# Patient Record
Sex: Male | Born: 1980 | Race: White | Hispanic: No | Marital: Single | Smoking: Current every day smoker
Health system: Southern US, Community
[De-identification: ages and names within clinical notes are randomized; demographics above are authoritative.]

## PROBLEM LIST (undated history)

## (undated) DIAGNOSIS — M779 Enthesopathy, unspecified: Secondary | ICD-10-CM

## (undated) DIAGNOSIS — G8929 Other chronic pain: Secondary | ICD-10-CM

## (undated) DIAGNOSIS — M549 Dorsalgia, unspecified: Secondary | ICD-10-CM

## (undated) DIAGNOSIS — E119 Type 2 diabetes mellitus without complications: Secondary | ICD-10-CM

## (undated) DIAGNOSIS — K2970 Gastritis, unspecified, without bleeding: Secondary | ICD-10-CM

## (undated) DIAGNOSIS — M1990 Unspecified osteoarthritis, unspecified site: Secondary | ICD-10-CM

## (undated) HISTORY — PX: ANTERIOR CERVICAL DECOMP/DISCECTOMY FUSION: SHX1161

---

## 2004-03-03 ENCOUNTER — Emergency Department: Payer: Self-pay | Admitting: Emergency Medicine

## 2005-03-03 ENCOUNTER — Emergency Department: Payer: Self-pay | Admitting: Emergency Medicine

## 2005-03-15 ENCOUNTER — Emergency Department: Payer: Self-pay | Admitting: Internal Medicine

## 2005-07-22 ENCOUNTER — Ambulatory Visit: Payer: Self-pay | Admitting: Family Medicine

## 2005-11-23 ENCOUNTER — Emergency Department: Payer: Self-pay | Admitting: Emergency Medicine

## 2005-12-05 ENCOUNTER — Emergency Department: Payer: Self-pay | Admitting: Emergency Medicine

## 2006-01-14 ENCOUNTER — Emergency Department: Payer: Self-pay | Admitting: Emergency Medicine

## 2006-04-29 ENCOUNTER — Emergency Department: Payer: Self-pay | Admitting: Emergency Medicine

## 2006-12-15 ENCOUNTER — Emergency Department: Payer: Self-pay | Admitting: Emergency Medicine

## 2007-01-07 ENCOUNTER — Emergency Department: Payer: Self-pay | Admitting: Emergency Medicine

## 2007-02-02 ENCOUNTER — Emergency Department: Payer: Self-pay | Admitting: Emergency Medicine

## 2007-02-03 ENCOUNTER — Emergency Department: Payer: Self-pay | Admitting: Internal Medicine

## 2007-03-28 ENCOUNTER — Emergency Department: Payer: Self-pay | Admitting: Emergency Medicine

## 2007-05-02 ENCOUNTER — Emergency Department: Payer: Self-pay | Admitting: Emergency Medicine

## 2011-01-29 ENCOUNTER — Emergency Department: Payer: Self-pay | Admitting: Emergency Medicine

## 2011-03-03 ENCOUNTER — Emergency Department: Payer: Self-pay | Admitting: Emergency Medicine

## 2011-03-09 ENCOUNTER — Emergency Department: Payer: Self-pay | Admitting: *Deleted

## 2012-09-06 ENCOUNTER — Ambulatory Visit: Payer: Self-pay

## 2013-04-24 ENCOUNTER — Emergency Department: Payer: Self-pay | Admitting: Emergency Medicine

## 2014-10-16 ENCOUNTER — Emergency Department
Admission: EM | Admit: 2014-10-16 | Discharge: 2014-10-16 | Disposition: A | Discharge status: Home / Self Care | Payer: Self-pay | Attending: Emergency Medicine | Admitting: Emergency Medicine

## 2014-10-16 ENCOUNTER — Encounter: Payer: Self-pay | Admitting: Urgent Care

## 2014-10-16 DIAGNOSIS — Z720 Tobacco use: Secondary | ICD-10-CM | POA: Insufficient documentation

## 2014-10-16 DIAGNOSIS — K2970 Gastritis, unspecified, without bleeding: Secondary | ICD-10-CM | POA: Insufficient documentation

## 2014-10-16 HISTORY — DX: Enthesopathy, unspecified: M77.9

## 2014-10-16 HISTORY — DX: Unspecified osteoarthritis, unspecified site: M19.90

## 2014-10-16 HISTORY — DX: Other chronic pain: G89.29

## 2014-10-16 HISTORY — DX: Dorsalgia, unspecified: M54.9

## 2014-10-16 LAB — COMPREHENSIVE METABOLIC PANEL
ALBUMIN: 4.1 g/dL (ref 3.5–5.0)
ALT: 30 U/L (ref 17–63)
AST: 23 U/L (ref 15–41)
Alkaline Phosphatase: 66 U/L (ref 38–126)
Anion gap: 10 (ref 5–15)
BUN: 11 mg/dL (ref 6–20)
CHLORIDE: 102 mmol/L (ref 101–111)
CO2: 25 mmol/L (ref 22–32)
CREATININE: 1.42 mg/dL — AB (ref 0.61–1.24)
Calcium: 8.8 mg/dL — ABNORMAL LOW (ref 8.9–10.3)
GFR calc non Af Amer: >60 mL/min (ref >60)
Glucose, Bld: 107 mg/dL — ABNORMAL HIGH (ref 65–99)
POTASSIUM: 3.8 mmol/L (ref 3.5–5.1)
Sodium: 137 mmol/L (ref 135–145)
TOTAL PROTEIN: 7.6 g/dL (ref 6.5–8.1)
Total Bilirubin: 0.6 mg/dL (ref 0.3–1.2)

## 2014-10-16 LAB — CBC WITH DIFFERENTIAL/PLATELET
BASOS PCT: 1 %
Basophils Absolute: 0.1 10*3/uL (ref 0–0.1)
Eosinophils Absolute: 0.2 10*3/uL (ref 0–0.7)
Eosinophils Relative: 2 %
HCT: 44.5 % (ref 40.0–52.0)
Hemoglobin: 14.9 g/dL (ref 13.0–18.0)
LYMPHS PCT: 20 %
Lymphs Abs: 2.1 10*3/uL (ref 1.0–3.6)
MCH: 30.3 pg (ref 26.0–34.0)
MCHC: 33.5 g/dL (ref 32.0–36.0)
MCV: 90.4 fL (ref 80.0–100.0)
Monocytes Absolute: 0.7 10*3/uL (ref 0.2–1.0)
Monocytes Relative: 6 %
Neutro Abs: 7.5 10*3/uL — ABNORMAL HIGH (ref 1.4–6.5)
Neutrophils Relative %: 71 %
Platelets: 172 10*3/uL (ref 150–440)
RBC: 4.92 MIL/uL (ref 4.40–5.90)
RDW: 13.2 % (ref 11.5–14.5)
WBC: 10.6 10*3/uL (ref 3.8–10.6)

## 2014-10-16 LAB — LIPASE, BLOOD: Lipase: 22 U/L (ref 22–51)

## 2014-10-16 MED ORDER — SUCRALFATE 1 G PO TABS
1.0000 g | ORAL_TABLET | Freq: Four times a day (QID) | ORAL | Status: DC
Start: 2014-10-16 — End: 2015-04-25

## 2014-10-16 MED ORDER — SODIUM CHLORIDE 0.9 % IV BOLUS (SEPSIS)
1000.0000 mL | Freq: Once | INTRAVENOUS | Status: AC
  Administered 2014-10-16: 1000 mL via INTRAVENOUS

## 2014-10-16 MED ORDER — RANITIDINE HCL 150 MG PO TABS: 150.0000 mg | ORAL_TABLET | Freq: Two times a day (BID) | ORAL | Status: DC

## 2014-10-16 MED ORDER — GI COCKTAIL ~~LOC~~
30.0000 mL | Freq: Once | ORAL | Status: AC
  Administered 2014-10-16: 30 mL via ORAL

## 2014-10-16 MED ORDER — GI COCKTAIL ~~LOC~~
ORAL | Status: AC
  Administered 2014-10-16: 30 mL via ORAL
  Filled 2014-10-16: qty 30

## 2014-10-16 MED ORDER — LORAZEPAM 0.5 MG PO TABS
0.5000 mg | ORAL_TABLET | Freq: Once | ORAL | Status: AC
  Administered 2014-10-16: 0.5 mg via ORAL

## 2014-10-16 MED ORDER — LORAZEPAM 0.5 MG PO TABS
ORAL_TABLET | ORAL | Status: AC
  Administered 2014-10-16: 0.5 mg via ORAL
  Filled 2014-10-16: qty 1

## 2014-10-16 NOTE — Discharge Instructions (Signed)
Please seek medical attention for any high fevers, chest pain, shortness of breath, change in behavior, persistent vomiting, bloody stool or any other new or concerning symptoms. ° °Gastritis, Adult °Gastritis is soreness and swelling (inflammation) of the lining of the stomach. Gastritis can develop as a sudden onset (acute) or long-term (chronic) condition. If gastritis is not treated, it can lead to stomach bleeding and ulcers. °CAUSES  °Gastritis occurs when the stomach lining is weak or damaged. Digestive juices from the stomach then inflame the weakened stomach lining. The stomach lining may be weak or damaged due to viral or bacterial infections. One common bacterial infection is the Helicobacter pylori infection. Gastritis can also result from excessive alcohol consumption, taking certain medicines, or having too much acid in the stomach.  °SYMPTOMS  °In some cases, there are no symptoms. When symptoms are present, they may include: °· Pain or a burning sensation in the upper abdomen. °· Nausea. °· Vomiting. °· An uncomfortable feeling of fullness after eating. °DIAGNOSIS  °Your caregiver may suspect you have gastritis based on your symptoms and a physical exam. To determine the cause of your gastritis, your caregiver may perform the following: °· Blood or stool tests to check for the H pylori bacterium. °· Gastroscopy. A thin, flexible tube (endoscope) is passed down the esophagus and into the stomach. The endoscope has a light and camera on the end. Your caregiver uses the endoscope to view the inside of the stomach. °· Taking a tissue sample (biopsy) from the stomach to examine under a microscope. °TREATMENT  °Depending on the cause of your gastritis, medicines may be prescribed. If you have a bacterial infection, such as an H pylori infection, antibiotics may be given. If your gastritis is caused by too much acid in the stomach, H2 blockers or antacids may be given. Your caregiver may recommend that you  stop taking aspirin, ibuprofen, or other nonsteroidal anti-inflammatory drugs (NSAIDs). °HOME CARE INSTRUCTIONS °· Only take over-the-counter or prescription medicines as directed by your caregiver. °· If you were given antibiotic medicines, take them as directed. Finish them even if you start to feel better. °· Drink enough fluids to keep your urine clear or pale yellow. °· Avoid foods and drinks that make your symptoms worse, such as: °¨ Caffeine or alcoholic drinks. °¨ Chocolate. °¨ Peppermint or mint flavorings. °¨ Garlic and onions. °¨ Spicy foods. °¨ Citrus fruits, such as oranges, lemons, or limes. °¨ Tomato-based foods such as sauce, chili, salsa, and pizza. °¨ Fried and fatty foods. °· Eat small, frequent meals instead of large meals. °SEEK IMMEDIATE MEDICAL CARE IF:  °· You have black or dark red stools. °· You vomit blood or material that looks like coffee grounds. °· You are unable to keep fluids down. °· Your abdominal pain gets worse. °· You have a fever. °· You do not feel better after 1 week. °· You have any other questions or concerns. °MAKE SURE YOU: °· Understand these instructions. °· Will watch your condition. °· Will get help right away if you are not doing well or get worse. °Document Released: 03/10/2001 Document Revised: 09/15/2011 Document Reviewed: 04/29/2011 °Exit[employer]® Patient Information ©2015 [employer]. This information is not intended to replace advice given to you by your health care provider. Make sure you discuss any questions you have with your health care provider. ° °

## 2014-10-16 NOTE — ED Notes (Signed)
Patient presents with upper abdominal pain - states, "No matter how little I eat..I feel full." Patient reporting chronic IBU use for chronic pain; stopped recently with some resolution of symptoms. Denies N/V/D/F and urinary symptoms.

## 2014-10-16 NOTE — ED Provider Notes (Signed)
[facility] Emergency Department Provider Note    ____________________________________________  Time seen: 2145  I have reviewed the triage vital signs and the nursing notes.   HISTORY  Chief Complaint Abdominal Pain   History limited by: Not Limited   HPI Mark Hinton is a 34 y.o. male who presents to the emergency department today with complaints of abdominal pain. He states this is been going on for days. He describes it as being located in the left upper quadrant. He gets the pain primarily after. He describes it as a sensation of fullness. The pain does not radiate to his back or into his chest. He has not noticed any associated change in his stooling. No bloody or black and tarry stooling.No fevers. Of note the patient does state that he has chronically been taking ibuprofen for a long time for his chronic back pain. He states he has recently stop ibuprofen. He had been taking an antacid with ibuprofen but is no longer taking the antacid as well.    Past Medical History  Diagnosis Date  . Bone spur     NECK  . Arthritis   . Chronic back pain     There are no active problems to display for this patient.   Past Surgical History  Procedure Laterality Date  . Anterior cervical decomp/discectomy fusion      C6 to C7    No current outpatient prescriptions on file.  Allergies Review of patient's allergies indicates no known allergies.  No family history on file.  Social History History  Substance Use Topics  . Smoking status: Current Every Day Smoker  . Smokeless tobacco: Not on file  . Alcohol Use: No    Review of Systems  Constitutional: Negative for fever. Cardiovascular: Negative for chest pain. Respiratory: Negative for shortness of breath. Gastrointestinal: Positive for right upper quadrant abdominal pain Genitourinary: Negative for dysuria. Musculoskeletal: Negative for back pain. Skin: Negative for rash. Neurological:  Negative for headaches, focal weakness or numbness.  10-point ROS otherwise negative.  ____________________________________________   PHYSICAL EXAM:  VITAL SIGNS: ED Triage Vitals  Enc Vitals Group     BP 10/16/14 2133 137/83 mmHg     Pulse Rate 10/16/14 2133 116     Resp 10/16/14 2133 18     Temp 10/16/14 2133 98.6 F (37 C)     Temp Source 10/16/14 2133 Oral     SpO2 10/16/14 2133 96 %     Weight 10/16/14 2133 265 lb (120.203 kg)     Height 10/16/14 2133  (1.753 m)     Head Cir --      Peak Flow --      Pain Score 10/16/14 2134 4   Constitutional: Alert and oriented. Well appearing and in no distress. Eyes: Conjunctivae are normal. PERRL. Normal extraocular movements. ENT   Head: Normocephalic and atraumatic.   Nose: No congestion/rhinnorhea.   Mouth/Throat: Mucous membranes are moist.   Neck: No stridor. Hematological/Lymphatic/Immunilogical: No cervical lymphadenopathy. Cardiovascular: Normal rate, regular rhythm.  No murmurs, rubs, or gallops. Respiratory: Normal respiratory effort without tachypnea nor retractions. Breath sounds are clear and equal bilaterally. No wheezes/rales/rhonchi. Gastrointestinal: Soft and nontender. No distention. No rebound. No guarding. Genitourinary: Deferred Musculoskeletal: Normal range of motion in all extremities. No joint effusions.  No lower extremity tenderness nor edema. Neurologic:  Normal speech and language. No gross focal neurologic deficits are appreciated. Speech is normal.  Skin:  Skin is warm, dry and intact. No rash  noted. Psychiatric: Mood and affect are normal. Speech and behavior are normal. Patient exhibits appropriate insight and judgment.  ____________________________________________    LABS (pertinent positives/negatives)  Labs Reviewed  COMPREHENSIVE METABOLIC PANEL - Abnormal; Notable for the following:    Glucose, Bld 107 (*)    Creatinine, Ser 1.42 (*)    Calcium 8.8 (*)    All other  components within normal limits  CBC WITH DIFFERENTIAL/PLATELET - Abnormal; Notable for the following:    Neutro Abs 7.5 (*)    All other components within normal limits  LIPASE, BLOOD     ____________________________________________   EKG  None  ____________________________________________    RADIOLOGY  None  ____________________________________________   PROCEDURES  Procedure(s) performed: None  Critical Care performed: No  ____________________________________________   INITIAL IMPRESSION / ASSESSMENT AND PLAN / ED COURSE  Pertinent labs & imaging results that were available during my care of the patient were reviewed by me and considered in my medical decision making (see chart for details).  Patient presented to the emergency department with concerns for left upper quadrant abdominal pain. Patient did have a long history of ibuprofen use. This point I think gastritis most likely given physical exam. We will however check blood work and give  GI cocktail.  ----------------------------------------- 10:57 PM on 10/16/2014 -----------------------------------------  Patient feels much improved. Will discharge home with an antacid as well as sucralfate. Discussed return precautions.  ____________________________________________   FINAL CLINICAL IMPRESSION(S) / ED DIAGNOSES  Final diagnoses:  Gastritis     Phineas SemenGraydon Nickalous Stingley, MD 10/16/14 2257

## 2014-11-26 ENCOUNTER — Emergency Department: Payer: Self-pay

## 2014-11-26 ENCOUNTER — Emergency Department
Admission: EM | Admit: 2014-11-26 | Discharge: 2014-11-26 | Disposition: A | Discharge status: Home / Self Care | Payer: Self-pay | Attending: Emergency Medicine | Admitting: Emergency Medicine

## 2014-11-26 ENCOUNTER — Encounter: Payer: Self-pay | Admitting: Emergency Medicine

## 2014-11-26 DIAGNOSIS — Z720 Tobacco use: Secondary | ICD-10-CM | POA: Insufficient documentation

## 2014-11-26 DIAGNOSIS — K5901 Slow transit constipation: Secondary | ICD-10-CM | POA: Insufficient documentation

## 2014-11-26 DIAGNOSIS — F419 Anxiety disorder, unspecified: Secondary | ICD-10-CM | POA: Insufficient documentation

## 2014-11-26 DIAGNOSIS — N390 Urinary tract infection, site not specified: Secondary | ICD-10-CM | POA: Insufficient documentation

## 2014-11-26 LAB — BASIC METABOLIC PANEL
Anion gap: 8 (ref 5–15)
BUN: 13 mg/dL (ref 6–20)
CO2: 26 mmol/L (ref 22–32)
Calcium: 9.2 mg/dL (ref 8.9–10.3)
Chloride: 102 mmol/L (ref 101–111)
Creatinine, Ser: 1.21 mg/dL (ref 0.61–1.24)
GFR calc Af Amer: >60 mL/min (ref >60)
GFR calc non Af Amer: >60 mL/min (ref >60)
Glucose, Bld: 118 mg/dL — ABNORMAL HIGH (ref 65–99)
POTASSIUM: 3.9 mmol/L (ref 3.5–5.1)
SODIUM: 136 mmol/L (ref 135–145)

## 2014-11-26 LAB — CBC WITH DIFFERENTIAL/PLATELET
BASOS ABS: 0.1 10*3/uL (ref 0–0.1)
Basophils Relative: 1 %
EOS ABS: 0.2 10*3/uL (ref 0–0.7)
EOS PCT: 2 %
HCT: 47.3 % (ref 40.0–52.0)
Hemoglobin: 15.8 g/dL (ref 13.0–18.0)
Lymphocytes Relative: 23 %
Lymphs Abs: 2.5 10*3/uL (ref 1.0–3.6)
MCH: 30 pg (ref 26.0–34.0)
MCHC: 33.4 g/dL (ref 32.0–36.0)
MCV: 90 fL (ref 80.0–100.0)
MONO ABS: 0.7 10*3/uL (ref 0.2–1.0)
Monocytes Relative: 7 %
Neutro Abs: 7.3 10*3/uL — ABNORMAL HIGH (ref 1.4–6.5)
Neutrophils Relative %: 67 %
Platelets: 183 10*3/uL (ref 150–440)
RBC: 5.26 MIL/uL (ref 4.40–5.90)
RDW: 13.8 % (ref 11.5–14.5)
WBC: 10.8 10*3/uL — AB (ref 3.8–10.6)

## 2014-11-26 LAB — URINALYSIS COMPLETE WITH MICROSCOPIC (ARMC ONLY)
BILIRUBIN URINE: NEGATIVE
Bacteria, UA: NONE SEEN
Glucose, UA: NEGATIVE mg/dL
Ketones, ur: NEGATIVE mg/dL
Nitrite: NEGATIVE
PH: 6 (ref 5.0–8.0)
Protein, ur: NEGATIVE mg/dL
Specific Gravity, Urine: 1.024 (ref 1.005–1.030)

## 2014-11-26 MED ORDER — SULFAMETHOXAZOLE-TRIMETHOPRIM 800-160 MG PO TABS
1.0000 | ORAL_TABLET | Freq: Once | ORAL | Status: AC
  Administered 2014-11-26: 1 via ORAL
  Filled 2014-11-26: qty 1

## 2014-11-26 MED ORDER — SULFAMETHOXAZOLE-TRIMETHOPRIM 800-160 MG PO TABS: 1.0000 | ORAL_TABLET | Freq: Two times a day (BID) | ORAL | Status: DC

## 2014-11-26 MED ORDER — POLYETHYLENE GLYCOL 3350 17 G PO PACK: 17.0000 g | PACK | Freq: Every day | ORAL | Status: DC

## 2014-11-26 MED ORDER — PHENAZOPYRIDINE HCL 200 MG PO TABS
200.0000 mg | ORAL_TABLET | Freq: Once | ORAL | Status: AC
  Administered 2014-11-26: 200 mg via ORAL
  Filled 2014-11-26: qty 1

## 2014-11-26 MED ORDER — CYCLOBENZAPRINE HCL 10 MG PO TABS: 10.0000 mg | ORAL_TABLET | Freq: Three times a day (TID) | ORAL | Status: AC | PRN

## 2014-11-26 NOTE — ED Notes (Addendum)
Patient complains of LLQ , side pain, and back pain all started last night. Pt reports decreased appetite past few days. No n/v. Denies any urination difficulty. LBM yesterday. Pt states "I've been having a lot of anxiety attacks lately."

## 2014-11-26 NOTE — ED Notes (Signed)
rx given to patient

## 2014-11-26 NOTE — Discharge Instructions (Signed)
Constipation °Constipation is when a person has fewer than three bowel movements a week, has difficulty having a bowel movement, or has stools that are dry, hard, or larger than normal. As people grow older, constipation is more common. If you try to fix constipation with medicines that make you have a bowel movement (laxatives), the problem may get worse. Long-term laxative use may cause the muscles of the colon to become weak. A low-fiber diet, not taking in enough fluids, and taking certain medicines may make constipation worse.  °CAUSES  °· Certain medicines, such as antidepressants, pain medicine, iron supplements, antacids, and water pills.   °· Certain diseases, such as diabetes, irritable bowel syndrome (IBS), thyroid disease, or depression.   °· Not drinking enough water.   °· Not eating enough fiber-rich foods.   °· Stress or travel.   °· Lack of physical activity or exercise.   °· Ignoring the urge to have a bowel movement.   °· Using laxatives too much.   °SIGNS AND SYMPTOMS  °· Having fewer than three bowel movements a week.   °· Straining to have a bowel movement.   °· Having stools that are hard, dry, or larger than normal.   °· Feeling full or bloated.   °· Pain in the lower abdomen.   °· Not feeling relief after having a bowel movement.   °DIAGNOSIS  °Your health care provider will take a medical history and perform a physical exam. Further testing may be done for severe constipation. Some tests may include: °· A barium enema X-ray to examine your rectum, colon, and, sometimes, your small intestine.   °· A sigmoidoscopy to examine your lower colon.   °· A colonoscopy to examine your entire colon. °TREATMENT  °Treatment will depend on the severity of your constipation and what is causing it. Some dietary treatments include drinking more fluids and eating more fiber-rich foods. Lifestyle treatments may include regular exercise. If these diet and lifestyle recommendations do not help, your health care  provider may recommend taking over-the-counter laxative medicines to help you have bowel movements. Prescription medicines may be prescribed if over-the-counter medicines do not work.  °HOME CARE INSTRUCTIONS  °· Eat foods that have a lot of fiber, such as fruits, vegetables, whole grains, and beans. °· Limit foods high in fat and processed sugars, such as french fries, hamburgers, cookies, candies, and soda.   °· A fiber supplement may be added to your diet if you cannot get enough fiber from foods.   °· Drink enough fluids to keep your urine clear or pale yellow.   °· Exercise regularly or as directed by your health care provider.   °· Go to the restroom when you have the urge to go. Do not hold it.   °· Only take over-the-counter or prescription medicines as directed by your health care provider. Do not take other medicines for constipation without talking to your health care provider first.   °SEEK IMMEDIATE MEDICAL CARE IF:  °· You have bright red blood in your stool.   °· Your constipation lasts for more than 4 days or gets worse.   °· You have abdominal or rectal pain.   °· You have thin, pencil-like stools.   °· You have unexplained weight loss. °MAKE SURE YOU:  °· Understand these instructions. °· Will watch your condition. °· Will get help right away if you are not doing well or get worse. °Document Released: 12/13/2003 Document Revised: 03/21/2013 Document Reviewed: 12/26/2012 °ExitCare® Patient Information ©2015 [employer]. This information is not intended to replace advice given to you by your health care provider. Make sure you discuss any questions   you have with your health care provider. °Urinary Tract Infection °Urinary tract infections (UTIs) can develop anywhere along your urinary tract. Your urinary tract is your body's drainage system for removing wastes and extra water. Your urinary tract includes two kidneys, two ureters, a bladder, and a urethra. Your kidneys are a pair of bean-shaped  organs. Each kidney is about the size of your fist. They are located below your ribs, one on each side of your spine. °CAUSES °Infections are caused by microbes, which are microscopic organisms, including fungi, viruses, and bacteria. These organisms are so small that they can only be seen through a microscope. Bacteria are the microbes that most commonly cause UTIs. °SYMPTOMS  °Symptoms of UTIs may vary by age and gender of the patient and by the location of the infection. Symptoms in young women typically include a frequent and intense urge to urinate and a painful, burning feeling in the bladder or urethra during urination. Older women and men are more likely to be tired, shaky, and weak and have muscle aches and abdominal pain. A fever may mean the infection is in your kidneys. Other symptoms of a kidney infection include pain in your back or sides below the ribs, nausea, and vomiting. °DIAGNOSIS °To diagnose a UTI, your caregiver will ask you about your symptoms. Your caregiver also will ask to provide a urine sample. The urine sample will be tested for bacteria and white blood cells. White blood cells are made by your body to help fight infection. °TREATMENT  °Typically, UTIs can be treated with medication. Because most UTIs are caused by a bacterial infection, they usually can be treated with the use of antibiotics. The choice of antibiotic and length of treatment depend on your symptoms and the type of bacteria causing your infection. °HOME CARE INSTRUCTIONS °· If you were prescribed antibiotics, take them exactly as your caregiver instructs you. Finish the medication even if you feel better after you have only taken some of the medication. °· Drink enough water and fluids to keep your urine clear or pale yellow. °· Avoid caffeine, tea, and carbonated beverages. They tend to irritate your bladder. °· Empty your bladder often. Avoid holding urine for long periods of time. °· Empty your bladder before and  after sexual intercourse. °· After a bowel movement, women should cleanse from front to back. Use each tissue only once. °SEEK MEDICAL CARE IF:  °· You have back pain. °· You develop a fever. °· Your symptoms do not begin to resolve within 3 days. °SEEK IMMEDIATE MEDICAL CARE IF:  °· You have severe back pain or lower abdominal pain. °· You develop chills. °· You have nausea or vomiting. °· You have continued burning or discomfort with urination. °MAKE SURE YOU:  °· Understand these instructions. °· Will watch your condition. °· Will get help right away if you are not doing well or get worse. °Document Released: 12/24/2004 Document Revised: 09/15/2011 Document Reviewed: 04/24/2011 °ExitCare® Patient Information ©2015 [employer]. This information is not intended to replace advice given to you by your health care provider. Make sure you discuss any questions you have with your health care provider. ° °

## 2014-11-26 NOTE — ED Notes (Signed)
Pt to ED with c/o LLQ abd. Pain and lower back pain since last Thursday, denies any n,v,d, also states he has been having a lot of panic attacks lately

## 2014-11-26 NOTE — ED Provider Notes (Signed)
[facility] Emergency Department Provider Note     Time seen: ----------------------------------------- 8:52 AM on 11/26/2014 -----------------------------------------    I have reviewed the triage vital signs and the nursing notes.   HISTORY  Chief Complaint Abdominal Pain and Back Pain    HPI Mark Hinton is a 34 y.o. male who presents ER with left lower quadrant sided back pain started last night. Patient reports decreased appetite for the past few days, denies fever or chills, denies chest pain or shortness of breath. Patient states been having a lot of panic attacks recently. Describes as a burning dull pain in his left side.   Past Medical History  Diagnosis Date  . Bone spur     NECK  . Arthritis   . Chronic back pain     There are no active problems to display for this patient.   Past Surgical History  Procedure Laterality Date  . Anterior cervical decomp/discectomy fusion      C6 to C7    Allergies Review of patient's allergies indicates no known allergies.  Social History Social History  Substance Use Topics  . Smoking status: Current Every Day Smoker  . Smokeless tobacco: None  . Alcohol Use: No    Review of Systems Constitutional: Negative for fever. Eyes: Negative for visual changes. ENT: Negative for sore throat. Cardiovascular: Negative for chest pain. Respiratory: Negative for shortness of breath. Gastrointestinal: Positive for abdominal pain Genitourinary: Negative for dysuria. Musculoskeletal: Negative for back pain. Skin: Negative for rash. Neurological: Negative for headaches, focal weakness or numbness. Psychiatric: Positive for anxiety 10-point ROS otherwise negative.  ____________________________________________   PHYSICAL EXAM:  VITAL SIGNS: ED Triage Vitals  Enc Vitals Group     BP 11/26/14 0824 135/93 mmHg     Pulse Rate 11/26/14 0824 126     Resp --      Temp 11/26/14 0824 98.3 F (36.8 C)     Temp Source 11/26/14 0824 Oral     SpO2 11/26/14 0824 97 %     Weight 11/26/14 0824 260 lb (117.935 kg)     Height 11/26/14 0824 5\' 9"  (1.753 m)     Head Cir --      Peak Flow --      Pain Score 11/26/14 0825 8     Pain Loc --      Pain Edu? --      Excl. in GC? --     Constitutional: Alert and oriented. Well appearing and in no distress. Eyes: Conjunctivae are normal. PERRL. Normal extraocular movements. ENT   Head: Normocephalic and atraumatic.   Nose: No congestion/rhinnorhea.   Mouth/Throat: Mucous membranes are moist.   Neck: No stridor. Cardiovascular: Normal rate, regular rhythm. Normal and symmetric distal pulses are present in all extremities. No murmurs, rubs, or gallops. Respiratory: Normal respiratory effort without tachypnea nor retractions. Breath sounds are clear and equal bilaterally. No wheezes/rales/rhonchi. Gastrointestinal: Soft and nontender. No distention. No abdominal bruits.  Musculoskeletal: Nontender with normal range of motion in all extremities. No joint effusions.  No lower extremity tenderness nor edema. Neurologic:  Normal speech and language. No gross focal neurologic deficits are appreciated. Speech is normal. No gait instability. Skin:  Skin is warm, dry and intact. No rash noted. Psychiatric: Mood and affect are normal. Speech and behavior are normal. Patient exhibits appropriate insight and judgment. ____________________________________________  ED COURSE:  Pertinent labs & imaging results that were available during my care of the patient were reviewed by me  and considered in my medical decision making (see chart for details). Flank pain and clear etiology. We'll check basic labs and KUB ____________________________________________    LABS (pertinent positives/negatives)  Labs Reviewed  CBC WITH DIFFERENTIAL/PLATELET - Abnormal; Notable for the following:    WBC 10.8 (*)    Neutro Abs 7.3 (*)    All other components within  normal limits  BASIC METABOLIC PANEL - Abnormal; Notable for the following:    Glucose, Bld 118 (*)    All other components within normal limits  URINALYSIS COMPLETEWITH MICROSCOPIC ([facility] ONLY) - Abnormal; Notable for the following:    Color, Urine YELLOW (*)    APPearance CLEAR (*)    Hgb urine dipstick 1+ (*)    Leukocytes, UA 2+ (*)    Squamous Epithelial / LPF 0-5 (*)    All other components within normal limits    RADIOLOGY Images were viewed by me  KUB is unremarkable except her constipation FINDINGS: Gas and stool in the colon. No significant gas within the small bowel loops. Limited evaluation for free air on these images. No large calcifications or stones in the region of the kidneys. Nonobstructive bowel gas pattern.  IMPRESSION: No acute abnormality. ____________________________________________  FINAL ASSESSMENT AND PLAN  Flank pain, constipation, UTI  Plan: Patient with labs and imaging as dictated above. Patient with a combination of UTI plus constipation. Discharge with Septra and MiraLAX. Advised to have heating pad and sinus therapy and stretching for treatment of his back pain. Encouraged him to exercise and follow-up with his doctor.  Emily Filbert, MD   Emily Filbert, MD 11/26/14 [phone]

## 2014-12-22 ENCOUNTER — Emergency Department: Payer: Self-pay

## 2014-12-22 ENCOUNTER — Encounter: Payer: Self-pay | Admitting: Emergency Medicine

## 2014-12-22 ENCOUNTER — Emergency Department
Admission: EM | Admit: 2014-12-22 | Discharge: 2014-12-22 | Disposition: A | Discharge status: Home / Self Care | Payer: Self-pay | Attending: Emergency Medicine | Admitting: Emergency Medicine

## 2014-12-22 DIAGNOSIS — Z79899 Other long term (current) drug therapy: Secondary | ICD-10-CM | POA: Insufficient documentation

## 2014-12-22 DIAGNOSIS — Z8719 Personal history of other diseases of the digestive system: Secondary | ICD-10-CM | POA: Insufficient documentation

## 2014-12-22 DIAGNOSIS — M79671 Pain in right foot: Secondary | ICD-10-CM | POA: Insufficient documentation

## 2014-12-22 DIAGNOSIS — Z720 Tobacco use: Secondary | ICD-10-CM | POA: Insufficient documentation

## 2014-12-22 MED ORDER — TRAMADOL HCL 50 MG PO TABS: 50.0000 mg | ORAL_TABLET | Freq: Four times a day (QID) | ORAL | Status: DC | PRN

## 2014-12-22 MED ORDER — PREDNISONE 10 MG PO TABS: ORAL_TABLET | ORAL | Status: DC

## 2014-12-22 NOTE — ED Provider Notes (Signed)
[facility] Emergency Department Provider Note  ____________________________________________  Time seen: Approximately 4:29 PM  I have reviewed the triage vital signs and the nursing notes.   HISTORY  Chief Complaint Foot Pain   HPI Mark Hinton is a 34 y.o. male is here with complaint of right foot pain. Patient states he was moving 2 weeks ago but is unaware of any injury. Patient states that he woke this morning with pain and swelling in his foot. He denies any previous injury or history of gout. He has not taken any over-the-counter medication for this. Area in question is on the top of his right foot with some swelling and pain is constant. Currently he rates his pain as 6 out of 10. Range of motion or walking increases his pain.   Past Medical History  Diagnosis Date  . Bone spur     NECK  . Arthritis   . Chronic back pain     There are no active problems to display for this patient.   Past Surgical History  Procedure Laterality Date  . Anterior cervical decomp/discectomy fusion      C6 to C7    Current Outpatient Rx  Name  Route  Sig  Dispense  Refill  . cyclobenzaprine (FLEXERIL) 10 MG tablet   Oral   Take 1 tablet (10 mg total) by mouth every 8 (eight) hours as needed for muscle spasms.   30 tablet   1   . polyethylene glycol (MIRALAX / GLYCOLAX) packet   Oral   Take 17 g by mouth daily.   14 each   0   . predniSONE (DELTASONE) 10 MG tablet      TAKE 3 TABLETS WITH FOOD FOR 3 DAYS   9 tablet   0   . ranitidine (ZANTAC) 150 MG tablet   Oral   Take 1 tablet (150 mg total) by mouth 2 (two) times daily.   60 tablet   0   . sucralfate (CARAFATE) 1 G tablet   Oral   Take 1 tablet (1 g total) by mouth 4 (four) times daily.   90 tablet   0   . sulfamethoxazole-trimethoprim (BACTRIM DS) 800-160 MG per tablet   Oral   Take 1 tablet by mouth 2 (two) times daily.   20 tablet   0   . traMADol (ULTRAM) 50 MG tablet   Oral    Take 1 tablet (50 mg total) by mouth every 6 (six) hours as needed.   20 tablet   0     Allergies Review of patient's allergies indicates no known allergies.  No family history on file.  Social History Social History  Substance Use Topics  . Smoking status: Current Every Day Smoker -- 0.50 packs/day    Types: Cigarettes  . Smokeless tobacco: None  . Alcohol Use: No    Review of Systems Constitutional: No fever/chills Cardiovascular: Denies chest pain. Respiratory: Denies shortness of breath. Gastrointestinal: History of gastritis  No nausea, no vomiting.   Musculoskeletal: Negative for back pain. Right foot pain Skin: Negative for rash. Neurological: Negative for headaches, focal weakness or numbness.  10-point ROS otherwise negative.  ____________________________________________   PHYSICAL EXAM:  VITAL SIGNS: ED Triage Vitals  Enc Vitals Group     BP 12/22/14 1538 126/84 mmHg     Pulse Rate 12/22/14 1538 118     Resp 12/22/14 1538 18     Temp 12/22/14 1538 97.9 F (36.6 C)  Temp src --      SpO2 12/22/14 1538 99 %     Weight 12/22/14 1538 261 lb (118.389 kg)     Height 12/22/14 1538  (1.753 m)     Head Cir --      Peak Flow --      Pain Score 12/22/14 1539 6     Pain Loc --      Pain Edu? --      Excl. in GC? --     Constitutional: Alert and oriented. Well appearing and in no acute distress. Eyes: Conjunctivae are normal. PERRL. EOMI. Head: Atraumatic. Nose: No congestion/rhinnorhea. Neck: No stridor.   Cardiovascular: Normal rate, regular rhythm. Grossly normal heart sounds.  Good peripheral circulation. Respiratory: Normal respiratory effort.  No retractions. Lungs CTAB. Gastrointestinal: Soft and nontender. No distention. Musculoskeletal: Right foot dorsal aspect there is some soft tissue swelling at the proximal portion of the foot. Moderate tenderness on palpation of soft tissue. There is no gross deformity. There is restriction in range  of motion secondary to pain. Nontender and no gross deformity of the ankle. Neurologic:  Normal speech and language. No gross focal neurologic deficits are appreciated. No gait instability. Skin:  Skin is warm, dry and intact. There is no erythema, ecchymosis or abrasions noted on the dorsal aspect of the right foot. Psychiatric: Mood and affect are normal. Speech and behavior are normal.  ____________________________________________   LABS (all labs ordered are listed, but only abnormal results are displayed)  Labs Reviewed - No data to display RADIOLOGY  Right foot no acute abnormality seen per radiologist I, Tommi Rumps, personally viewed and evaluated these images (plain radiographs) as part of my medical decision making.  ____________________________________________   PROCEDURES  Procedure(s) performed: None  Critical Care performed: No  ____________________________________________   INITIAL IMPRESSION / ASSESSMENT AND PLAN / ED COURSE  Pertinent labs & imaging results that were available during my care of the patient were reviewed by me and considered in my medical decision making (see chart for details)  Patient is aware of x-ray results. There is no erythema or warmth which does not suggest that this is gout. Patient was placed in a postop shoe and started on tramadol for pain and prednisone 3 tablets for 3 days. He was told to make sure he ate while taking the prednisone. He is to follow-up with the podiatrist if no improvement. Etiology of his pain is unknown at this time.   ____________________________________________   FINAL CLINICAL IMPRESSION(S) / ED DIAGNOSES  Final diagnoses:  Foot pain, right      Tommi Rumps, PA-C 12/22/14 1708  Jene Every, MD 12/22/14 2351

## 2014-12-22 NOTE — Discharge Instructions (Signed)
FOLLOW UP WITH DR. TROXLER IF ANY CONTINUED PROBLEMS  WEAR WOODEN SHOE FOR SUPPORT AND TAKE MEDICATIONS AS DIRECTED

## 2014-12-22 NOTE — ED Notes (Signed)
Pt discharged home after verbalizing understanding of discharge instructions; nad noted. 

## 2014-12-22 NOTE — ED Notes (Signed)
Pt reports pain in his right foot with "swollen spot" after moving 2 weeks ago that went away until he awakened this morning to more pain and swelling in the same foot. Pain in right ankle and top of right foot. Pt very tender, and jumped when this nurse was palpating for pulse in foot. Pt alert & oriented with warm, dry skin. NAD noted.

## 2014-12-22 NOTE — ED Notes (Signed)
States was moving 2 weeks ago, does not remember specific injury but noted swelling top of R foot and pain at that time and since

## 2015-04-24 ENCOUNTER — Encounter: Payer: Self-pay | Admitting: *Deleted

## 2015-04-24 ENCOUNTER — Emergency Department
Admission: EM | Admit: 2015-04-24 | Discharge: 2015-04-25 | Disposition: A | Discharge status: Home / Self Care | Payer: Self-pay | Attending: Emergency Medicine | Admitting: Emergency Medicine

## 2015-04-24 DIAGNOSIS — K8020 Calculus of gallbladder without cholecystitis without obstruction: Secondary | ICD-10-CM | POA: Insufficient documentation

## 2015-04-24 DIAGNOSIS — Z7952 Long term (current) use of systemic steroids: Secondary | ICD-10-CM | POA: Insufficient documentation

## 2015-04-24 DIAGNOSIS — F17210 Nicotine dependence, cigarettes, uncomplicated: Secondary | ICD-10-CM | POA: Insufficient documentation

## 2015-04-24 DIAGNOSIS — R109 Unspecified abdominal pain: Secondary | ICD-10-CM

## 2015-04-24 DIAGNOSIS — Z79899 Other long term (current) drug therapy: Secondary | ICD-10-CM | POA: Insufficient documentation

## 2015-04-24 LAB — CBC
HEMATOCRIT: 43.3 % (ref 40.0–52.0)
Hemoglobin: 14.4 g/dL (ref 13.0–18.0)
MCH: 29.9 pg (ref 26.0–34.0)
MCHC: 33.2 g/dL (ref 32.0–36.0)
MCV: 89.9 fL (ref 80.0–100.0)
PLATELETS: 184 10*3/uL (ref 150–440)
RBC: 4.81 MIL/uL (ref 4.40–5.90)
RDW: 13.9 % (ref 11.5–14.5)
WBC: 11.8 10*3/uL — AB (ref 3.8–10.6)

## 2015-04-24 LAB — COMPREHENSIVE METABOLIC PANEL
ALT: 20 U/L (ref 17–63)
AST: 18 U/L (ref 15–41)
Albumin: 4 g/dL (ref 3.5–5.0)
Alkaline Phosphatase: 69 U/L (ref 38–126)
Anion gap: 5 (ref 5–15)
BUN: 13 mg/dL (ref 6–20)
CHLORIDE: 103 mmol/L (ref 101–111)
CO2: 29 mmol/L (ref 22–32)
Calcium: 9 mg/dL (ref 8.9–10.3)
Creatinine, Ser: 1.49 mg/dL — ABNORMAL HIGH (ref 0.61–1.24)
Glucose, Bld: 107 mg/dL — ABNORMAL HIGH (ref 65–99)
POTASSIUM: 4.4 mmol/L (ref 3.5–5.1)
Sodium: 137 mmol/L (ref 135–145)
Total Bilirubin: 1 mg/dL (ref 0.3–1.2)
Total Protein: 7.7 g/dL (ref 6.5–8.1)

## 2015-04-24 LAB — LIPASE, BLOOD: LIPASE: 38 U/L (ref 11–51)

## 2015-04-24 NOTE — ED Notes (Signed)
Unable to void at this time.

## 2015-04-24 NOTE — ED Notes (Signed)
Pt reports abd pain for 2-3 days.  No n/v/d.  Pt states burning sensation in left side of abdomen.  No back pain.  No urinary sx.  Pt alert

## 2015-04-25 ENCOUNTER — Emergency Department: Payer: Self-pay

## 2015-04-25 LAB — URINALYSIS COMPLETE WITH MICROSCOPIC (ARMC ONLY)
Bacteria, UA: NONE SEEN
Bilirubin Urine: NEGATIVE
Glucose, UA: NEGATIVE mg/dL
Hgb urine dipstick: NEGATIVE
Ketones, ur: NEGATIVE mg/dL
Leukocytes, UA: NEGATIVE
Nitrite: NEGATIVE
PH: 6 (ref 5.0–8.0)
Protein, ur: NEGATIVE mg/dL
SPECIFIC GRAVITY, URINE: 1.02 (ref 1.005–1.030)

## 2015-04-25 MED ORDER — IOHEXOL 240 MG/ML SOLN
25.0000 mL | Freq: Once | INTRAMUSCULAR | Status: AC | PRN
  Administered 2015-04-25: 25 mL via ORAL

## 2015-04-25 MED ORDER — IOHEXOL 300 MG/ML  SOLN
125.0000 mL | Freq: Once | INTRAMUSCULAR | Status: AC | PRN
  Administered 2015-04-25: 125 mL via INTRAVENOUS

## 2015-04-25 MED ORDER — SUCRALFATE 1 G PO TABS: 1.0000 g | ORAL_TABLET | Freq: Four times a day (QID) | ORAL | Status: DC | PRN

## 2015-04-25 NOTE — ED Notes (Signed)

## 2015-04-25 NOTE — ED Provider Notes (Signed)
[facility] Emergency Department Provider Note  ____________________________________________  Time seen: Approximately 12:11 AM  I have reviewed the triage vital signs and the nursing notes.   HISTORY  Chief Complaint Abdominal Pain    HPI Mark Hinton is a 35 y.o. male with a history of gastritis who takes sucralfate and omeprazole presents by private vehicle with report of 2-3 days of gradual onset but worsening left-sided abdominal pain.  He denies nausea, vomiting, and diarrhea.  He states that it started as a dull ache in the left side of his abdomen but is now a burning sensation.  He has no epigastric discomfort and no pain in the right lower quadrant or in the right upper quadrant.  Nothing makes it better and nothing makes it worse.  He describes it as mild to moderate and it was keeping him from being able to sleep tonight.  He is also worried given his history of gastritis that "something might be wrong".  He smokes cigarettes but does not drink alcohol.  He has not had any blood in his stool or any black or tarry stools.He has never had a colonoscopy.   Past Medical History  Diagnosis Date  . Bone spur     NECK  . Arthritis   . Chronic back pain     There are no active problems to display for this patient.   Past Surgical History  Procedure Laterality Date  . Anterior cervical decomp/discectomy fusion      C6 to C7    Current Outpatient Rx  Name  Route  Sig  Dispense  Refill  . cyclobenzaprine (FLEXERIL) 10 MG tablet   Oral   Take 1 tablet (10 mg total) by mouth every 8 (eight) hours as needed for muscle spasms.   30 tablet   1   . polyethylene glycol (MIRALAX / GLYCOLAX) packet   Oral   Take 17 g by mouth daily.   14 each   0   . predniSONE (DELTASONE) 10 MG tablet      TAKE 3 TABLETS WITH FOOD FOR 3 DAYS   9 tablet   0   . ranitidine (ZANTAC) 150 MG tablet   Oral   Take 1 tablet (150 mg total) by mouth 2 (two) times daily.   60 tablet   0   . sucralfate (CARAFATE) 1 g tablet   Oral   Take 1 tablet (1 g total) by mouth 4 (four) times daily as needed (for abdominal discomfort, nausea, and/or vomiting).   30 tablet   1   . sulfamethoxazole-trimethoprim (BACTRIM DS) 800-160 MG per tablet   Oral   Take 1 tablet by mouth 2 (two) times daily.   20 tablet   0   . traMADol (ULTRAM) 50 MG tablet   Oral   Take 1 tablet (50 mg total) by mouth every 6 (six) hours as needed.   20 tablet   0     Allergies Review of patient's allergies indicates no known allergies.  No family history on file.  Social History Social History  Substance Use Topics  . Smoking status: Current Every Day Smoker -- 0.50 packs/day    Types: Cigarettes  . Smokeless tobacco: None  . Alcohol Use: No    Review of Systems Constitutional: No fever/chills Eyes: No visual changes. ENT: No sore throat. Cardiovascular: Denies chest pain. Respiratory: Denies shortness of breath. Gastrointestinal: Left-sided burning abdominal pain.  No nausea, no vomiting.  No diarrhea.  No  constipation.  No blood in the stool or black and tarry stools. Genitourinary: Negative for dysuria. Musculoskeletal: Negative for back pain. Skin: Negative for rash. Neurological: Negative for headaches, focal weakness or numbness.  10-point ROS otherwise negative.  ____________________________________________   PHYSICAL EXAM:  VITAL SIGNS: ED Triage Vitals  Enc Vitals Group     BP 04/24/15 2247 107/90 mmHg     Pulse Rate 04/24/15 2247 86     Resp 04/24/15 2247 20     Temp 04/24/15 2247 98.9 F (37.2 C)     Temp Source 04/24/15 2247 Oral     SpO2 04/24/15 2247 99 %     Weight 04/24/15 2247 260 lb (117.935 kg)     Height 04/24/15 2247  (1.753 m)     Head Cir --      Peak Flow --      Pain Score 04/24/15 2248 4     Pain Loc --      Pain Edu? --      Excl. in GC? --     Constitutional: Alert and oriented. Well appearing and in no acute  distress. Eyes: Conjunctivae are normal. PERRL. EOMI. Head: Atraumatic. Nose: No congestion/rhinnorhea. Mouth/Throat: Mucous membranes are moist.  Oropharynx non-erythematous. Neck: No stridor.   Cardiovascular: Normal rate, regular rhythm. Grossly normal heart sounds.  Good peripheral circulation. Respiratory: Normal respiratory effort.  No retractions. Lungs CTAB. Gastrointestinal: Soft and obese.  No tenderness to palpation of McBurney's point, negative Murphy's sign and no right upper quadrant tenderness.  Tender to palpation of the left side of the abdomen with rebound tenderness.  No evidence of peritonitis. Musculoskeletal: No lower extremity tenderness nor edema.  No joint effusions. Neurologic:  Normal speech and language. No gross focal neurologic deficits are appreciated.  Skin:  Skin is warm, dry and intact. No rash noted. Psychiatric: Mood and affect are normal. Speech and behavior are normal.  ____________________________________________   LABS (all labs ordered are listed, but only abnormal results are displayed)  Labs Reviewed  COMPREHENSIVE METABOLIC PANEL - Abnormal; Notable for the following:    Glucose, Bld 107 (*)    Creatinine, Ser 1.49 (*)    All other components within normal limits  CBC - Abnormal; Notable for the following:    WBC 11.8 (*)    All other components within normal limits  URINALYSIS COMPLETEWITH MICROSCOPIC ([facility] ONLY) - Abnormal; Notable for the following:    Color, Urine YELLOW (*)    APPearance CLEAR (*)    Squamous Epithelial / LPF 0-5 (*)    All other components within normal limits  LIPASE, BLOOD   ____________________________________________  EKG  None ____________________________________________  RADIOLOGY   Ct Abdomen Pelvis W Contrast  04/25/2015  CLINICAL DATA:  Left lower quadrant pain with rebound tenderness. Abdominal pain for 3 days. Burning sensation left abdomen. History of gastritis. EXAM: CT ABDOMEN AND PELVIS  WITH CONTRAST TECHNIQUE: Multidetector CT imaging of the abdomen and pelvis was performed using the standard protocol following bolus administration of intravenous contrast. CONTRAST:  OMNIPAQUE IOHEXOL 300 MG/ML  SOLN COMPARISON:  None. FINDINGS: Mild dependent changes in the lung bases. Large stone in the gallbladder. No inflammatory wall thickening or infiltration. The liver, spleen, pancreas, adrenal glands, kidneys, abdominal aorta, inferior vena cava, and retroperitoneal lymph nodes are unremarkable. Stomach, small bowel, and colon are not abnormally distended. No wall thickening is appreciated. No free air or free fluid in the abdomen. Abdominal wall musculature appears intact. Pelvis: The  appendix is normal. Bladder wall is not thickened. Prostate gland is not enlarged. No free or loculated pelvic fluid collections. No pelvic mass or lymphadenopathy. No evidence of diverticulitis. No destructive bone lesions. IMPRESSION: Cholelithiasis. No acute process demonstrated in the abdomen or pelvis. No evidence of bowel obstruction or inflammation. Electronically Signed   By: Burman Nieves M.D.   On: 04/25/2015 01:45    ____________________________________________   PROCEDURES  Procedure(s) performed: None  Critical Care performed: No ____________________________________________   INITIAL IMPRESSION / ASSESSMENT AND PLAN / ED COURSE  Pertinent labs & imaging results that were available during my care of the patient were reviewed by me and considered in my medical decision making (see chart for details).  Generally well-appearing with normal vital signs and reassuring labs.  However, he is tender on the left side of his abdomen with some rebound tenderness, and it is possible that this represents a diverticulitis.  The patient is concerned with his history of gastritis and wants to evaluate further.  I will obtain a CT scan with oral and IV contrast to help guide the patient's outpatient  therapy.  ----------------------------------------- 2:24 AM on 04/25/2015 -----------------------------------------  Single gallstone without any sign of obstruction or cholelithiasis on the CT scan.  Creatinine is slightly elevated but BUN is normal, vital signs are stable, GFR is greater than 60, no acute concerns.  I discussed the gallstone with the patient and advised outpatient follow-up but also explained that it is unclear if this is the cause of his left-sided abdominal pain.  I gave my usual and customary return precautions.  Patient understands and agrees with the plan. ____________________________________________  FINAL CLINICAL IMPRESSION(S) / ED DIAGNOSES  Final diagnoses:  Left sided abdominal pain  Cholelithiasis without cholecystitis      NEW MEDICATIONS STARTED DURING THIS VISIT:  New Prescriptions   SUCRALFATE (CARAFATE) 1 G TABLET    Take 1 tablet (1 g total) by mouth 4 (four) times daily as needed (for abdominal discomfort, nausea, and/or vomiting).     Loleta Rose, MD 04/25/15 Emeline Darling

## 2015-04-25 NOTE — Discharge Instructions (Signed)
You have been seen in the Emergency Department (ED) for abdominal pain.  Your evaluation did not identify a clear cause of your symptoms but was generally reassuring.  We did discover the you have a gallstone and it is important that you follow-up with a surgeon as an outpatient to discuss whether or not you should have your gallbladder removed at a convenient time.  It is unclear that this is the cause of your left sided abdominal pain, however.  Please follow up as instructed above regarding todays emergent visit and the symptoms that are bothering you.  Return to the ED if your abdominal pain worsens or fails to improve, you develop bloody vomiting, bloody diarrhea, you are unable to tolerate fluids due to vomiting, fever greater than 101, or other symptoms that concern you.   Abdominal Pain, Adult Many things can cause abdominal pain. Usually, abdominal pain is not caused by a disease and will improve without treatment. It can often be observed and treated at home. Your health care provider will do a physical exam and possibly order blood tests and X-rays to help determine the seriousness of your pain. However, in many cases, more time must pass before a clear cause of the pain can be found. Before that point, your health care provider may not know if you need more testing or further treatment. HOME CARE INSTRUCTIONS Monitor your abdominal pain for any changes. The following actions may help to alleviate any discomfort you are experiencing:  Only take over-the-counter or prescription medicines as directed by your health care provider.  Do not take laxatives unless directed to do so by your health care provider.  Try a clear liquid diet (broth, tea, or water) as directed by your health care provider. Slowly move to a bland diet as tolerated. SEEK MEDICAL CARE IF:  You have unexplained abdominal pain.  You have abdominal pain associated with nausea or diarrhea.  You have pain when you  urinate or have a bowel movement.  You experience abdominal pain that wakes you in the night.  You have abdominal pain that is worsened or improved by eating food.  You have abdominal pain that is worsened with eating fatty foods.  You have a fever. SEEK IMMEDIATE MEDICAL CARE IF:  Your pain does not go away within 2 hours.  You keep throwing up (vomiting).  Your pain is felt only in portions of the abdomen, such as the right side or the left lower portion of the abdomen.  You pass bloody or black tarry stools. MAKE SURE YOU:  Understand these instructions.  Will watch your condition.  Will get help right away if you are not doing well or get worse.   This information is not intended to replace advice given to you by your health care provider. Make sure you discuss any questions you have with your health care provider.   Document Released: 12/24/2004 Document Revised: 12/05/2014 Document Reviewed: 11/23/2012 Elsevier Interactive Patient Education 2016 [employer]  Biliary Colic Biliary colic is a pain in the upper abdomen. The pain:  Is usually felt on the right side of the abdomen, but it may also be felt in the center of the abdomen, just below the breastbone (sternum).  May spread back toward the right shoulder blade.  May be steady or irregular.  May be accompanied by nausea and vomiting. Most of the time, the pain goes away in 1-5 hours. After the most intense pain passes, the abdomen may continue to ache mildly  for about 24 hours. Biliary colic is caused by a blockage in the bile duct. The bile duct is a pathway that carries bile--a liquid that helps to digest fats--from the gallbladder to the small intestine. Biliary colic usually occurs after eating, when the digestive system demands bile. The pain develops when muscle cells contract forcefully to try to move the blockage so that bile can get by. HOME CARE INSTRUCTIONS  Take medicines only as directed by your  health care provider.  Drink enough fluid to keep your urine clear or pale yellow.  Avoid fatty, greasy, and fried foods. These kinds of foods increase your body's demand for bile.  Avoid any foods that make your pain worse.  Avoid overeating.  Avoid having a large meal after fasting. SEEK MEDICAL CARE IF:  You develop a fever.  Your pain gets worse.  You vomit.  You develop nausea that prevents you from eating and drinking. SEEK IMMEDIATE MEDICAL CARE IF:  You suddenly develop a fever and shaking chills.  You develop a yellowish discoloration (jaundice) of:  Skin.  Whites of the eyes.  Mucous membranes.  You have continuous or severe pain that is not relieved with medicines.  You have nausea and vomiting that is not relieved with medicines.  You develop dizziness or you faint.   This information is not intended to replace advice given to you by your health care provider. Make sure you discuss any questions you have with your health care provider.   Document Released: 08/17/2005 Document Revised: 07/31/2014 Document Reviewed: 12/26/2013 Elsevier Interactive Patient Education [employer]

## 2015-08-10 ENCOUNTER — Encounter: Payer: Self-pay | Admitting: Emergency Medicine

## 2015-08-10 ENCOUNTER — Emergency Department: Payer: Self-pay

## 2015-08-10 ENCOUNTER — Emergency Department
Admission: EM | Admit: 2015-08-10 | Discharge: 2015-08-10 | Disposition: A | Discharge status: Home / Self Care | Payer: Self-pay | Attending: Emergency Medicine | Admitting: Emergency Medicine

## 2015-08-10 DIAGNOSIS — R079 Chest pain, unspecified: Secondary | ICD-10-CM | POA: Insufficient documentation

## 2015-08-10 DIAGNOSIS — M1990 Unspecified osteoarthritis, unspecified site: Secondary | ICD-10-CM | POA: Insufficient documentation

## 2015-08-10 DIAGNOSIS — Z7952 Long term (current) use of systemic steroids: Secondary | ICD-10-CM | POA: Insufficient documentation

## 2015-08-10 DIAGNOSIS — F17210 Nicotine dependence, cigarettes, uncomplicated: Secondary | ICD-10-CM | POA: Insufficient documentation

## 2015-08-10 DIAGNOSIS — Z792 Long term (current) use of antibiotics: Secondary | ICD-10-CM | POA: Insufficient documentation

## 2015-08-10 DIAGNOSIS — Z79899 Other long term (current) drug therapy: Secondary | ICD-10-CM | POA: Insufficient documentation

## 2015-08-10 DIAGNOSIS — K2970 Gastritis, unspecified, without bleeding: Secondary | ICD-10-CM | POA: Insufficient documentation

## 2015-08-10 HISTORY — DX: Gastritis, unspecified, without bleeding: K29.70

## 2015-08-10 LAB — CBC
HEMATOCRIT: 40.5 % (ref 40.0–52.0)
HEMOGLOBIN: 13.5 g/dL (ref 13.0–18.0)
MCH: 30.4 pg (ref 26.0–34.0)
MCHC: 33.4 g/dL (ref 32.0–36.0)
MCV: 91 fL (ref 80.0–100.0)
Platelets: 170 10*3/uL (ref 150–440)
RBC: 4.45 MIL/uL (ref 4.40–5.90)
RDW: 13.9 % (ref 11.5–14.5)
WBC: 9.6 10*3/uL (ref 3.8–10.6)

## 2015-08-10 LAB — BASIC METABOLIC PANEL
ANION GAP: 6 (ref 5–15)
BUN: 11 mg/dL (ref 6–20)
CO2: 27 mmol/L (ref 22–32)
Calcium: 9.1 mg/dL (ref 8.9–10.3)
Chloride: 104 mmol/L (ref 101–111)
Creatinine, Ser: 1.07 mg/dL (ref 0.61–1.24)
GFR calc Af Amer: >60 mL/min (ref >60)
GFR calc non Af Amer: >60 mL/min (ref >60)
GLUCOSE: 117 mg/dL — AB (ref 65–99)
POTASSIUM: 3.8 mmol/L (ref 3.5–5.1)
Sodium: 137 mmol/L (ref 135–145)

## 2015-08-10 LAB — TROPONIN I: Troponin I: <0.03 ng/mL (ref <0.031)

## 2015-08-10 MED ORDER — GI COCKTAIL ~~LOC~~
30.0000 mL | Freq: Once | ORAL | Status: AC
  Administered 2015-08-10: 30 mL via ORAL
  Filled 2015-08-10: qty 30

## 2015-08-10 MED ORDER — OXYCODONE-ACETAMINOPHEN 5-325 MG PO TABS
1.0000 | ORAL_TABLET | ORAL | Status: DC | PRN
  Administered 2015-08-10: 1 via ORAL
  Filled 2015-08-10: qty 1

## 2015-08-10 NOTE — ED Notes (Signed)
MD at bedside. 

## 2015-08-10 NOTE — ED Provider Notes (Signed)
[facility] Emergency Department Provider Note   ____________________________________________  Time seen: Approximately 710 AM  I have reviewed the triage vital signs and the nursing notes.   HISTORY  Chief Complaint Chest Pain   HPI Mark Hinton is a 35 y.o. male with a history of gastritis was presenting to the emergency department today with lower chest pain which he describes as a burning pain. He says it is a 1 out of 10 right now. He says it started at about midnight and was a 4-5 out of 10 at that time. He says it is radiating through to his back. It is not associated with any nausea, vomiting, shortness of breath or diaphoresis. He says that he had this similar pain about a week ago after eating sausage. He says he ate the same sausage yesterday and again had the same pain. He took Pepto-Bismol home without any relief. Does take a daily omeprazole. Says that his GI cocktail hasn't lead to this sort of pain in the past. Denies any history of blood clots or heart disease.   Past Medical History  Diagnosis Date  . Bone spur     NECK  . Arthritis   . Chronic back pain   . Gastritis     There are no active problems to display for this patient.   Past Surgical History  Procedure Laterality Date  . Anterior cervical decomp/discectomy fusion      C6 to C7    Current Outpatient Rx  Name  Route  Sig  Dispense  Refill  . cyclobenzaprine (FLEXERIL) 10 MG tablet   Oral   Take 1 tablet (10 mg total) by mouth every 8 (eight) hours as needed for muscle spasms.   30 tablet   1   . polyethylene glycol (MIRALAX / GLYCOLAX) packet   Oral   Take 17 g by mouth daily.   14 each   0   . predniSONE (DELTASONE) 10 MG tablet      TAKE 3 TABLETS WITH FOOD FOR 3 DAYS   9 tablet   0   . ranitidine (ZANTAC) 150 MG tablet   Oral   Take 1 tablet (150 mg total) by mouth 2 (two) times daily.   60 tablet   0   . sucralfate (CARAFATE) 1 g tablet   Oral  Take 1 tablet (1 g total) by mouth 4 (four) times daily as needed (for abdominal discomfort, nausea, and/or vomiting).   30 tablet   1   . sulfamethoxazole-trimethoprim (BACTRIM DS) 800-160 MG per tablet   Oral   Take 1 tablet by mouth 2 (two) times daily.   20 tablet   0   . traMADol (ULTRAM) 50 MG tablet   Oral   Take 1 tablet (50 mg total) by mouth every 6 (six) hours as needed.   20 tablet   0     Allergies Review of patient's allergies indicates no known allergies.  No family history on file.  Social History Social History  Substance Use Topics  . Smoking status: Current Every Day Smoker -- 0.50 packs/day    Types: Cigarettes  . Smokeless tobacco: None  . Alcohol Use: No    Review of Systems Constitutional: No fever/chills Eyes: No visual changes. ENT: No sore throat. Cardiovascular: As above Respiratory: Denies shortness of breath. Gastrointestinal: No abdominal pain.  No nausea, no vomiting.  No diarrhea.  No constipation. Genitourinary: Negative for dysuria. Musculoskeletal: Negative for back pain. Skin:  Negative for rash. Neurological: Negative for headaches, focal weakness or numbness.  10-point ROS otherwise negative.  ____________________________________________   PHYSICAL EXAM:  VITAL SIGNS: ED Triage Vitals  Enc Vitals Group     BP 08/10/15 0344 135/82 mmHg     Pulse Rate 08/10/15 0344 63     Resp 08/10/15 0344 18     Temp 08/10/15 0344 98.1 F (36.7 C)     Temp Source 08/10/15 0344 Oral     SpO2 08/10/15 0344 98 %     Weight 08/10/15 0344 260 lb (117.935 kg)     Height 08/10/15 0344 5\' 9"  (1.753 m)     Head Cir --      Peak Flow --      Pain Score 08/10/15 0344 8     Pain Loc --      Pain Edu? --      Excl. in GC? --     Constitutional: Alert and oriented. Well appearing and in no acute distress. Eyes: Conjunctivae are normal. PERRL. EOMI. Head: Atraumatic. Nose: No congestion/rhinnorhea. Mouth/Throat: Mucous membranes are  moist.   Neck: No stridor.   Cardiovascular: Normal rate, regular rhythm. Grossly normal heart sounds.  Good peripheral circulation with intact bilateral radial as well as dorsalis pedis pulses. Respiratory: Normal respiratory effort.  No retractions. Lungs CTAB. Gastrointestinal: Soft and nontender. No distention. No abdominal bruits. No CVA tenderness. Musculoskeletal: No lower extremity tenderness nor edema.  No joint effusions. Neurologic:  Normal speech and language. No gross focal neurologic deficits are appreciated.  Skin:  Skin is warm, dry and intact. No rash noted. Psychiatric: Mood and affect are normal. Speech and behavior are normal.  ____________________________________________   LABS (all labs ordered are listed, but only abnormal results are displayed)  Labs Reviewed  BASIC METABOLIC PANEL - Abnormal; Notable for the following:    Glucose, Bld 117 (*)    All other components within normal limits  CBC  TROPONIN I   ____________________________________________  EKG  ED ECG REPORT I, Arelia Longest, the attending physician, personally viewed and interpreted this ECG.   Date: 08/10/2015  EKG Time: 337  Rate: 58  Rhythm: sinus bradycardia  Axis: Normal  Intervals:none  ST&T Change: No ST segment elevation or depression. No abnormal T-wave inversion.  ____________________________________________  RADIOLOGY  DG Chest 2 View (Final result) Result time: 08/10/15 04:07:37   Final result by Rad Results In Interface (08/10/15 04:07:37)   Narrative:   CLINICAL DATA: Midchest pain, onset this morning.  EXAM: CHEST 2 VIEW  COMPARISON: 03/09/2011  FINDINGS: The lungs are clear. The pulmonary vasculature is normal. Heart size is normal. Hilar and mediastinal contours are unremarkable. There is no pleural effusion.  IMPRESSION: No active cardiopulmonary disease.   Electronically Signed By: Ellery Plunk M.D. On: 08/10/2015 04:07            ____________________________________________   PROCEDURES    ____________________________________________   INITIAL IMPRESSION / ASSESSMENT AND PLAN / ED COURSE  Pertinent labs & imaging results that were available during my care of the patient were reviewed by me and considered in my medical decision making (see chart for details).  ----------------------------------------- 8:20 AM on 08/10/2015 -----------------------------------------  Patient received a GI cocktail and his pain is now completely gone. Nose not taking any more sausage and that this is likely trigger food for his gastritis which is likely causing reflux at this time.PERC negative.  Heart score of 0.  We'll discharge to home. ____________________________________________  FINAL CLINICAL IMPRESSION(S) / ED DIAGNOSES  Final diagnoses:  Chest pain, unspecified chest pain type  Gastritis      NEW MEDICATIONS STARTED DURING THIS VISIT:  New Prescriptions   No medications on file     Note:  This document was prepared using Dragon voice recognition software and may include unintentional dictation errors.    Myrna Blazeravid Matthew Halen Antenucci, MD 08/10/15 [phone]

## 2015-08-10 NOTE — ED Notes (Signed)
Patient with complaint of central chest pain that started around midnight. Patient reports that he took pepto without any relief. Patient denies shortness of breath or nausea.

## 2017-04-30 IMAGING — CR DG ABDOMEN 1V
2 series · 2 of 2 positions shown · non-contrast
Comparison: 09/06/2012

CLINICAL DATA: Left flank pain.  Recent gastritis.

EXAM:
ABDOMEN - 1 VIEW

[abdomen kub (1 of 2)]
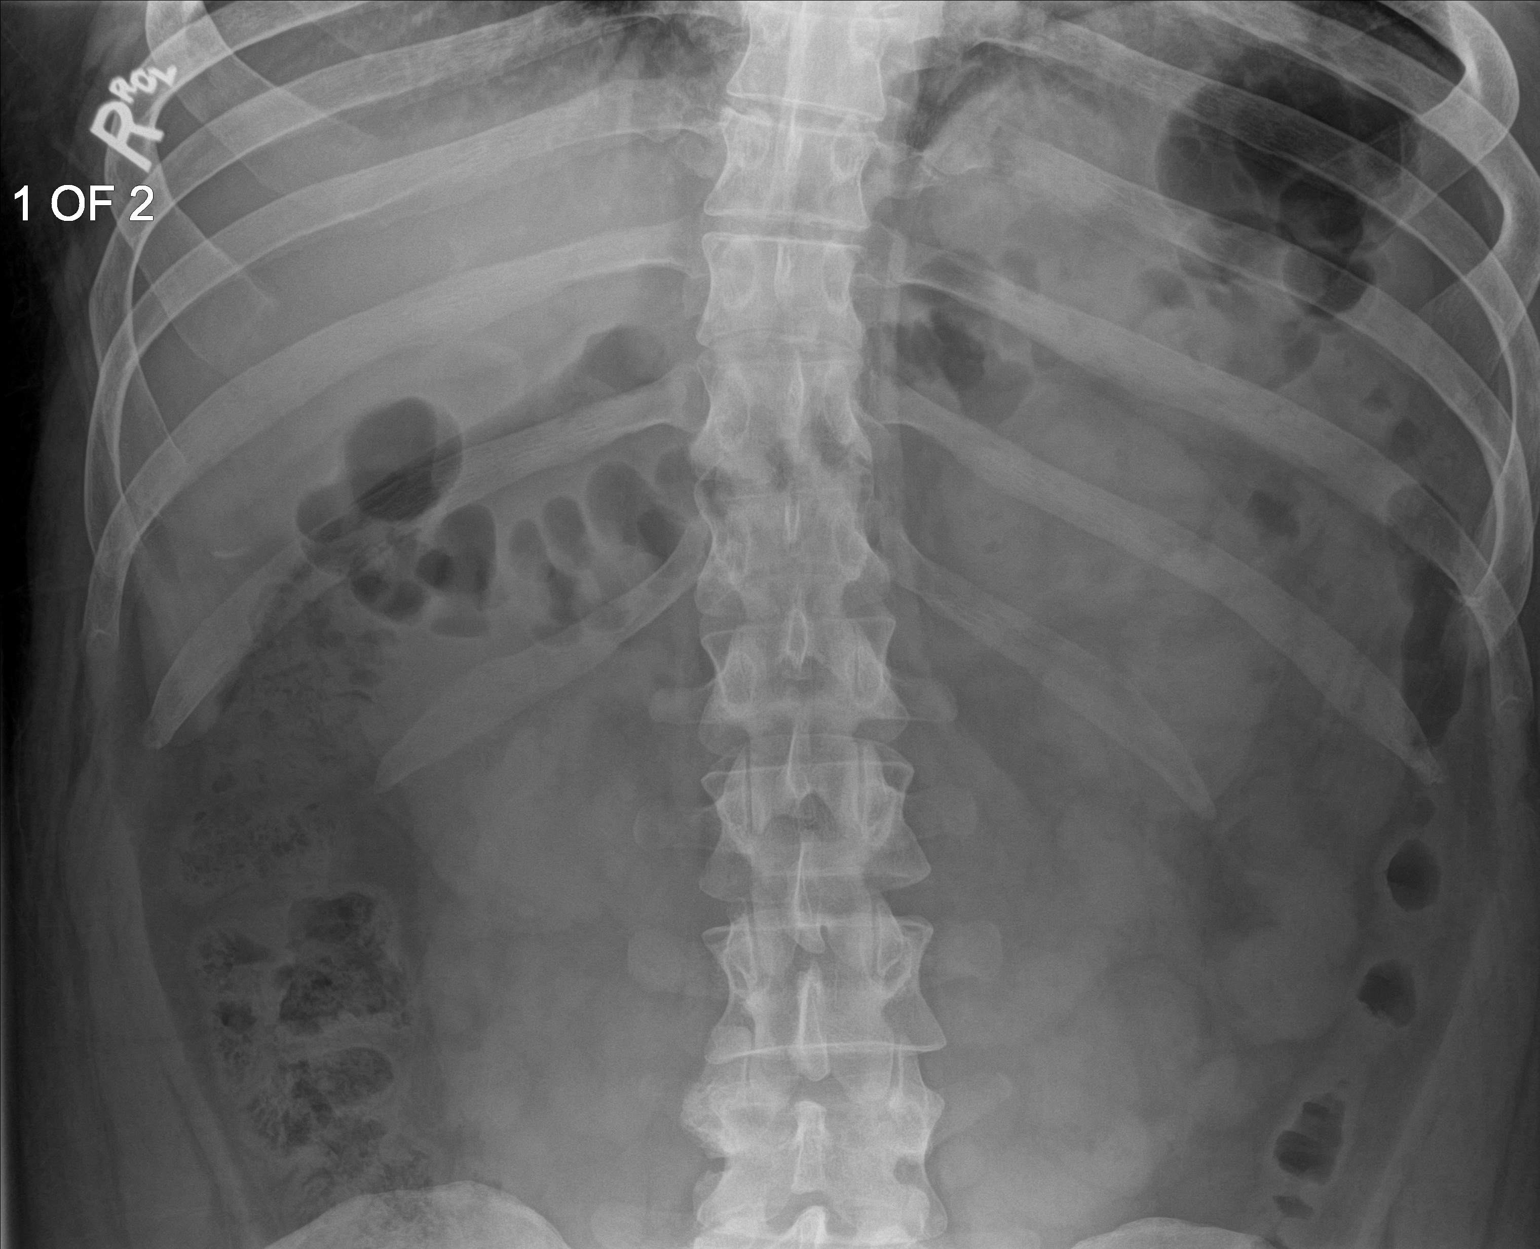

[abdomen kub (2 of 2)]
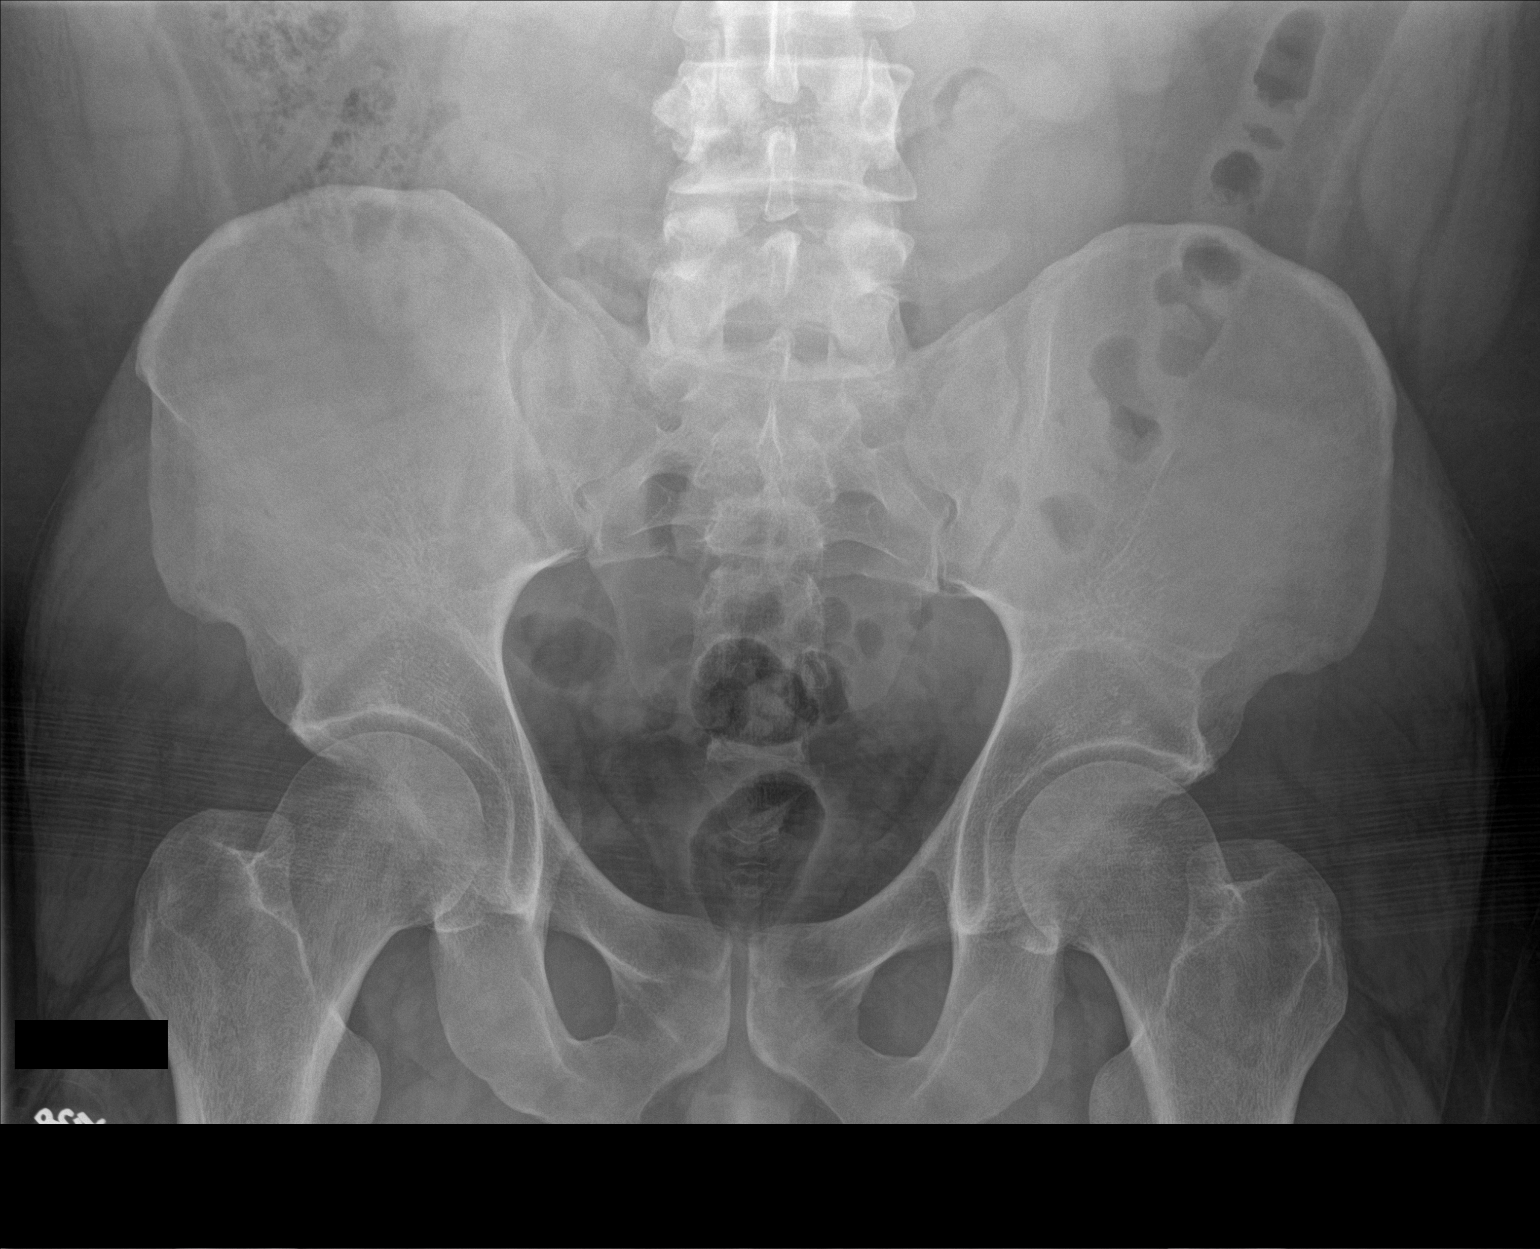

[2 of 2 positions shown; findings below may reference images not displayed]

FINDINGS: Gas and stool in the colon. No significant gas within the small
bowel loops. Limited evaluation for free air on these images. No
large calcifications or stones in the region of the kidneys.
Nonobstructive bowel gas pattern.
IMPRESSION: No acute abnormality.

## 2017-11-02 ENCOUNTER — Encounter: Payer: Self-pay | Admitting: Emergency Medicine

## 2017-11-02 ENCOUNTER — Emergency Department
Admission: EM | Admit: 2017-11-02 | Discharge: 2017-11-02 | Disposition: A | Discharge status: Home / Self Care | Payer: Medicaid - Out of State | Attending: Emergency Medicine | Admitting: Emergency Medicine

## 2017-11-02 DIAGNOSIS — Y998 Other external cause status: Secondary | ICD-10-CM | POA: Insufficient documentation

## 2017-11-02 DIAGNOSIS — Z79899 Other long term (current) drug therapy: Secondary | ICD-10-CM | POA: Insufficient documentation

## 2017-11-02 DIAGNOSIS — F17210 Nicotine dependence, cigarettes, uncomplicated: Secondary | ICD-10-CM | POA: Insufficient documentation

## 2017-11-02 DIAGNOSIS — Y929 Unspecified place or not applicable: Secondary | ICD-10-CM | POA: Diagnosis not present

## 2017-11-02 DIAGNOSIS — X58XXXA Exposure to other specified factors, initial encounter: Secondary | ICD-10-CM | POA: Diagnosis not present

## 2017-11-02 DIAGNOSIS — Y9389 Activity, other specified: Secondary | ICD-10-CM | POA: Diagnosis not present

## 2017-11-02 DIAGNOSIS — S39012A Strain of muscle, fascia and tendon of lower back, initial encounter: Secondary | ICD-10-CM | POA: Diagnosis not present

## 2017-11-02 DIAGNOSIS — S3992XA Unspecified injury of lower back, initial encounter: Secondary | ICD-10-CM | POA: Diagnosis present

## 2017-11-02 MED ORDER — NAPROXEN 500 MG PO TABS: 500.0000 mg | ORAL_TABLET | Freq: Two times a day (BID) | ORAL | Status: DC

## 2017-11-02 MED ORDER — LIDOCAINE 5 % EX PTCH: 1.0000 | MEDICATED_PATCH | Freq: Two times a day (BID) | CUTANEOUS | 0 refills | Status: DC

## 2017-11-02 MED ORDER — ORPHENADRINE CITRATE ER 100 MG PO TB12: 100.0000 mg | ORAL_TABLET | Freq: Two times a day (BID) | ORAL | 0 refills | Status: DC

## 2017-11-02 NOTE — ED Notes (Signed)
See triage note  Presents with lower back pain  States pain is non radiating   Unsure of injury  Ambulates well   Denies any urinary sxs'

## 2017-11-02 NOTE — ED Provider Notes (Signed)
[facility] Emergency Department Provider Note   ____________________________________________   First MD Initiated Contact with Patient 11/02/17 1024     (approximate)  I have reviewed the triage vital signs and the nursing notes.   HISTORY  Chief Complaint Back Pain    HPI Mark Hinton is a 37 y.o. male patient complain of right low back pain for 2 weeks.  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  Patient denies radicular component to his back pain.  Patient denies bladder bowel dysfunction.  Patient describes his pain is "achy/spasmatic".  No palliative measure for complaint.  Patient rates the pain as 4/10.  Patient states pain decreases when he is off for 2 days.  Patient the pain returns at the end of each workday.  Patient states he has a history of chronic back pain with no other provocative incident.  No acute findings on previous imaging.  No surgical history for back pain. Past Medical History:  Diagnosis Date  . Arthritis   . Bone spur    NECK  . Chronic back pain   . Gastritis     There are no active problems to display for this patient.   Past Surgical History:  Procedure Laterality Date  . ANTERIOR CERVICAL DECOMP/DISCECTOMY FUSION     C6 to C7    Prior to Admission medications   Medication Sig Start Date End Date Taking? Authorizing Provider  polyethylene glycol (MIRALAX / GLYCOLAX) packet Take 17 g by mouth daily. 11/26/14   Emily FilbertWilliams, Jonathan E, MD  predniSONE (DELTASONE) 10 MG tablet TAKE 3 TABLETS WITH FOOD FOR 3 DAYS 12/22/14   Tommi RumpsSummers, Rhonda L, PA-C  ranitidine (ZANTAC) 150 MG tablet Take 1 tablet (150 mg total) by mouth 2 (two) times daily. 10/16/14 10/16/15  Phineas SemenGoodman, Graydon, MD  sucralfate (CARAFATE) 1 g tablet Take 1 tablet (1 g total) by mouth 4 (four) times daily as needed (for abdominal discomfort, nausea, and/or vomiting). 04/25/15   Loleta RoseForbach, Cory, MD  sulfamethoxazole-trimethoprim  (BACTRIM DS) 800-160 MG per tablet Take 1 tablet by mouth 2 (two) times daily. 11/26/14   Emily FilbertWilliams, Jonathan E, MD  traMADol (ULTRAM) 50 MG tablet Take 1 tablet (50 mg total) by mouth every 6 (six) hours as needed. 12/22/14   Tommi RumpsSummers, Rhonda L, PA-C    Allergies Patient has no known allergies.  No family history on file.  Social History Social History   Tobacco Use  . Smoking status: Current Every Day Smoker    Packs/day: 0.50    Types: Cigarettes  Substance Use Topics  . Alcohol use: No  . Drug use: Not on file    Review of Systems Constitutional: No fever/chills Eyes: No visual changes. ENT: No sore throat. Cardiovascular: Denies chest pain. Respiratory: Denies shortness of breath. Gastrointestinal: No abdominal pain.  No nausea, no vomiting.  No diarrhea.  No constipation. Genitourinary: Negative for dysuria. Musculoskeletal: Positive for back pain. Skin: Negative for rash. Neurological: Negative for headaches, focal weakness or numbness.   ____________________________________________   PHYSICAL EXAM:  VITAL SIGNS: ED Triage Vitals  Enc Vitals Group     BP 11/02/17 1007 125/88     Pulse Rate 11/02/17 1007 97     Resp 11/02/17 1007 20     Temp 11/02/17 1007 98.1 F (36.7 C)     Temp Source 11/02/17 1007 Oral     SpO2 11/02/17 1007 97 %     Weight 11/02/17 1008 259 lb (117.5  kg)     Height 11/02/17 1008 5\' 9"  (1.753 m)     Head Circumference --      Peak Flow --      Pain Score 11/02/17 1008 4     Pain Loc --      Pain Edu? --      Excl. in GC? --    Constitutional: Alert and oriented. Well appearing and in no acute distress. Hematological/Lymphatic/Immunilogical: No cervical lymphadenopathy. Cardiovascular: Normal rate, regular rhythm. Grossly normal heart sounds.  Good peripheral circulation. Respiratory: Normal respiratory effort.  No retractions. Lungs CTAB. Musculoskeletal: No obvious spinal deformity of the lumbar spine.  There is no guarding  palpation spinal processes.  Patient has moderate guarding palpation right paraspinal muscle area.  Patient muscle spasm with left lateral movements.  Patient has negative straight leg test in supine position.. Neurologic:  Normal speech and language. No gross focal neurologic deficits are appreciated. No gait instability. Skin:  Skin is warm, dry and intact. No rash noted. Psychiatric: Mood and affect are normal. Speech and behavior are normal.  ____________________________________________   LABS (all labs ordered are listed, but only abnormal results are displayed)  Labs Reviewed - No data to display ____________________________________________  EKG   ____________________________________________  RADIOLOGY  ED MD interpretation:    Official radiology report(s): No results found.  ____________________________________________   PROCEDURES  Procedure(s) performed: None  Procedures  Critical Care performed: No  ____________________________________________   INITIAL IMPRESSION / ASSESSMENT AND PLAN / ED COURSE  As part of my medical decision making, I reviewed the following data within the electronic MEDICAL RECORD NUMBER    Right paraspinal muscle pain.  Patient given discharge care instruction advised take medication as directed.  Patient advised to follow-up PCP for continued care.      ____________________________________________   FINAL CLINICAL IMPRESSION(S) / ED DIAGNOSES  Final diagnoses:  None     ED Discharge Orders    None       Note:  This document was prepared using Dragon voice recognition software and may include unintentional dictation errors.    Joni Reining, PA-C 11/02/17 1100    Merrily Brittle, MD 11/02/17 1148

## 2017-11-02 NOTE — ED Triage Notes (Signed)
Pt reports that he thinks she pulled something in his lower back. Pt reports pain for awhile but getting worse. Denies urinary sx's. Or other sx's.

## 2018-03-06 ENCOUNTER — Other Ambulatory Visit: Payer: Self-pay

## 2018-03-06 ENCOUNTER — Emergency Department
Admission: EM | Admit: 2018-03-06 | Discharge: 2018-03-06 | Disposition: A | Discharge status: Home / Self Care | Payer: Medicaid - Out of State | Attending: Emergency Medicine | Admitting: Emergency Medicine

## 2018-03-06 DIAGNOSIS — S3992XA Unspecified injury of lower back, initial encounter: Secondary | ICD-10-CM | POA: Diagnosis present

## 2018-03-06 DIAGNOSIS — Y990 Civilian activity done for income or pay: Secondary | ICD-10-CM | POA: Insufficient documentation

## 2018-03-06 DIAGNOSIS — Y9389 Activity, other specified: Secondary | ICD-10-CM | POA: Diagnosis not present

## 2018-03-06 DIAGNOSIS — S39012A Strain of muscle, fascia and tendon of lower back, initial encounter: Secondary | ICD-10-CM | POA: Diagnosis not present

## 2018-03-06 DIAGNOSIS — Y929 Unspecified place or not applicable: Secondary | ICD-10-CM | POA: Insufficient documentation

## 2018-03-06 DIAGNOSIS — F17210 Nicotine dependence, cigarettes, uncomplicated: Secondary | ICD-10-CM | POA: Insufficient documentation

## 2018-03-06 DIAGNOSIS — X500XXA Overexertion from strenuous movement or load, initial encounter: Secondary | ICD-10-CM | POA: Insufficient documentation

## 2018-03-06 MED ORDER — PREDNISONE 10 MG (21) PO TBPK: ORAL_TABLET | ORAL | 0 refills | Status: DC

## 2018-03-06 MED ORDER — BACLOFEN 10 MG PO TABS: 10.0000 mg | ORAL_TABLET | Freq: Three times a day (TID) | ORAL | 1 refills | Status: DC

## 2018-03-06 NOTE — ED Triage Notes (Signed)
Pt c/o lower back pain , states he did it last night when he bent down quickly at the waist and pulled something. States it is not a w/c case.

## 2018-03-06 NOTE — Discharge Instructions (Addendum)
Follow-up with your regular doctor if not better in 3 to 5 days.  Return to the emergency department if worsening.  Use the medications as prescribed.  Apply ice to the lower back.  Use a lumbar support while driving in your car.  If the baclofen makes you drowsy cut this in half prior to going to work.

## 2018-03-06 NOTE — ED Provider Notes (Signed)
[facility] Emergency Department Provider Note  ____________________________________________   First MD Initiated Contact with Patient 03/06/18 1200     (approximate)  I have reviewed the triage vital signs and the nursing notes.   HISTORY  Chief Complaint Back Pain    HPI Mark Hinton is a 37 y.o. male  C/o low back pain for 1 day, positive known injury as he bent over without bending his knees while at work., pain is worse with movement, increased with bending over, denies numbness, tingling, or changes in bowel/urinary habits,  Using otc meds without relief Remainder ros neg   Past Medical History:  Diagnosis Date  . Arthritis   . Bone spur    NECK  . Chronic back pain   . Gastritis     There are no active problems to display for this patient.   Past Surgical History:  Procedure Laterality Date  . ANTERIOR CERVICAL DECOMP/DISCECTOMY FUSION     C6 to C7    Prior to Admission medications   Medication Sig Start Date End Date Taking? Authorizing Provider  baclofen (LIORESAL) 10 MG tablet Take 1 tablet (10 mg total) by mouth 3 (three) times daily. 03/06/18 03/06/19  Alizeh Madril, Roselyn BeringSusan W, PA-C  predniSONE (STERAPRED UNI-PAK 21 TAB) 10 MG (21) TBPK tablet Take 6 pills on day one then decrease by 1 pill each day 03/06/18   Faythe GheeFisher, Ayzia Day W, PA-C    Allergies Patient has no known allergies.  No family history on file.  Social History Social History   Tobacco Use  . Smoking status: Current Every Day Smoker    Packs/day: 0.50    Types: Cigarettes  . Smokeless tobacco: Never Used  Substance Use Topics  . Alcohol use: No  . Drug use: Not Currently    Review of Systems  Constitutional: No fever/chills Eyes: No visual changes. ENT: No sore throat. Respiratory: Denies cough Genitourinary: Negative for dysuria. Musculoskeletal: Positive for back pain. Skin: Negative for rash.    ____________________________________________   PHYSICAL  EXAM:  VITAL SIGNS: ED Triage Vitals  Enc Vitals Group     BP 03/06/18 1024 136/82     Hinton Rate 03/06/18 1024 85     Resp 03/06/18 1024 15     Temp 03/06/18 1024 98.6 F (37 C)     Temp Source 03/06/18 1024 Oral     SpO2 03/06/18 1024 96 %     Weight 03/06/18 1025 260 lb (117.9 kg)     Height 03/06/18 1025 5\' 9"  (1.753 m)     Head Circumference --      Peak Flow --      Pain Score 03/06/18 1145 2     Pain Loc --      Pain Edu? --      Excl. in GC? --     Constitutional: Alert and oriented. Well appearing and in no acute distress. Eyes: Conjunctivae are normal.  Head: Atraumatic. Nose: No congestion/rhinnorhea. Mouth/Throat: Mucous membranes are moist.   Neck:  supple no lymphadenopathy noted Cardiovascular: Normal rate, regular rhythm. Heart sounds are normal Respiratory: Normal respiratory effort.  No retractions, lungs c t a  Abd: soft nontender bs normal all 4 quad GU: deferred Musculoskeletal: FROM all extremities, warm and well perfused.  Decreased rom of back due to discomfort, patient is worse with bending forward.  Lumbar spine nontender, negative Slr, full strength in great toes b/l, full strength in lower legs, n/v intact Neurologic:  Normal speech and  language.  Skin:  Skin is warm, dry and intact. No rash noted. Psychiatric: Mood and affect are normal. Speech and behavior are normal.  ____________________________________________   LABS (all labs ordered are listed, but only abnormal results are displayed)  Labs Reviewed - No data to display ____________________________________________   ____________________________________________  RADIOLOGY    ____________________________________________   PROCEDURES  Procedure(s) performed: No  Procedures    ____________________________________________   INITIAL IMPRESSION / ASSESSMENT AND PLAN / ED COURSE  Pertinent labs & imaging results that were available during my care of the patient were  reviewed by me and considered in my medical decision making (see chart for details).   Patient is 37 year old male presents complaining of lower back pain after he bent over without bending his knees and lifted something off the floor at work.  On physical exam shows pain is reproduced with bending forward.  Remainder the exam is unremarkable.  Patient was given a prescription for Sterapred as he states he gets gastritis with NSAIDs.  He was given a prescription for baclofen.  He is to apply ice to his lower back.  Follow-up with Dr. Odis Luster if not better in 5 to 7 days or see his regular doctor.  He states he understands will comply appears discharged stable condition.     As part of my medical decision making, I reviewed the following data within the electronic MEDICAL RECORD NUMBER Nursing notes reviewed and incorporated, Old chart reviewed, Notes from prior ED visits and [place] Controlled Substance Database  ____________________________________________   FINAL CLINICAL IMPRESSION(S) / ED DIAGNOSES  Final diagnoses:  Acute myofascial strain of lumbar region, initial encounter      NEW MEDICATIONS STARTED DURING THIS VISIT:  New Prescriptions   BACLOFEN (LIORESAL) 10 MG TABLET    Take 1 tablet (10 mg total) by mouth 3 (three) times daily.   PREDNISONE (STERAPRED UNI-PAK 21 TAB) 10 MG (21) TBPK TABLET    Take 6 pills on day one then decrease by 1 pill each day     Note:  This document was prepared using Dragon voice recognition software and may include unintentional dictation errors.     Faythe Ghee, PA-C 03/06/18 1620    Minna Antis, MD 03/07/18 [id number]

## 2018-12-11 ENCOUNTER — Other Ambulatory Visit: Payer: Self-pay

## 2018-12-11 ENCOUNTER — Encounter: Payer: Self-pay | Admitting: Emergency Medicine

## 2018-12-11 ENCOUNTER — Emergency Department
Admission: EM | Admit: 2018-12-11 | Discharge: 2018-12-11 | Disposition: A | Discharge status: Home / Self Care | Payer: Medicaid - Out of State | Attending: Student in an Organized Health Care Education/Training Program | Admitting: Student in an Organized Health Care Education/Training Program

## 2018-12-11 ENCOUNTER — Emergency Department: Payer: Medicaid - Out of State

## 2018-12-11 DIAGNOSIS — Z79899 Other long term (current) drug therapy: Secondary | ICD-10-CM | POA: Diagnosis not present

## 2018-12-11 DIAGNOSIS — Y9389 Activity, other specified: Secondary | ICD-10-CM | POA: Insufficient documentation

## 2018-12-11 DIAGNOSIS — S39012A Strain of muscle, fascia and tendon of lower back, initial encounter: Secondary | ICD-10-CM | POA: Diagnosis not present

## 2018-12-11 DIAGNOSIS — S3992XA Unspecified injury of lower back, initial encounter: Secondary | ICD-10-CM | POA: Diagnosis present

## 2018-12-11 DIAGNOSIS — Y999 Unspecified external cause status: Secondary | ICD-10-CM | POA: Insufficient documentation

## 2018-12-11 DIAGNOSIS — F17210 Nicotine dependence, cigarettes, uncomplicated: Secondary | ICD-10-CM | POA: Insufficient documentation

## 2018-12-11 DIAGNOSIS — X500XXA Overexertion from strenuous movement or load, initial encounter: Secondary | ICD-10-CM | POA: Diagnosis not present

## 2018-12-11 DIAGNOSIS — Y929 Unspecified place or not applicable: Secondary | ICD-10-CM | POA: Diagnosis not present

## 2018-12-11 MED ORDER — HYDROCODONE-ACETAMINOPHEN 5-325 MG PO TABS
1.0000 | ORAL_TABLET | Freq: Once | ORAL | Status: AC
  Administered 2018-12-11: 1 via ORAL
  Filled 2018-12-11: qty 1

## 2018-12-11 MED ORDER — METHOCARBAMOL 500 MG PO TABS
1000.0000 mg | ORAL_TABLET | Freq: Once | ORAL | Status: AC
  Administered 2018-12-11: 1000 mg via ORAL
  Filled 2018-12-11: qty 2

## 2018-12-11 MED ORDER — HYDROCODONE-ACETAMINOPHEN 5-325 MG PO TABS: 1.0000 | ORAL_TABLET | Freq: Four times a day (QID) | ORAL | 0 refills | Status: DC | PRN

## 2018-12-11 MED ORDER — METHOCARBAMOL 500 MG PO TABS: ORAL_TABLET | ORAL | 0 refills | Status: DC

## 2018-12-11 NOTE — ED Notes (Signed)
See triage note  Presents with lower back pain  States he felt a "pull" when he tried to get up from his chair  Ambulates slowly d/t pain  States pain is non radiating

## 2018-12-11 NOTE — ED Provider Notes (Signed)
[facility] Emergency Department Provider Note  ____________________________________________   First MD Initiated Contact with Patient 12/11/18 1225     (approximate)  I have reviewed the triage vital signs and the nursing notes.   HISTORY  Chief Complaint Back Pain   HPI Mark RiggsJamie Hinton is a 38 y.o. male presents to the ED with complaint of right lower back pain.  Patient states that he felt a "pull" when he was getting out of his chair today.  He states that this is happened before and has been seen in the ED for back pain.  He denies any radiation of his pain.  He denies any urinary symptoms or history of kidney stones.  Patient continues to ambulate since this occurrence.  He rates pain as an 8 out of 10.       Past Medical History:  Diagnosis Date  . Arthritis   . Bone spur    NECK  . Chronic back pain   . Gastritis     There are no active problems to display for this patient.   Past Surgical History:  Procedure Laterality Date  . ANTERIOR CERVICAL DECOMP/DISCECTOMY FUSION     C6 to C7    Prior to Admission medications   Medication Sig Start Date End Date Taking? Authorizing Provider  sertraline (ZOLOFT) 50 MG tablet Take 50 mg by mouth daily.   Yes [provider]  HYDROcodone-acetaminophen (NORCO/VICODIN) 5-325 MG tablet Take 1 tablet by mouth every 6 (six) hours as needed for moderate pain. 12/11/18   Tommi RumpsSummers, Rhonda L, PA-C  methocarbamol (ROBAXIN) 500 MG tablet 1 or 2 tablets every 6 hours as needed for muscle spasms 12/11/18   Tommi RumpsSummers, Rhonda L, PA-C    Allergies Patient has no known allergies.  History reviewed. No pertinent family history.  Social History Social History   Tobacco Use  . Smoking status: Current Every Day Smoker    Packs/day: 0.50    Types: Cigarettes  . Smokeless tobacco: Never Used  Substance Use Topics  . Alcohol use: No  . Drug use: Not Currently    Review of Systems Constitutional: No  fever/chills Cardiovascular: Denies chest pain. Respiratory: Denies shortness of breath. Genitourinary: Negative for dysuria. Musculoskeletal: Positive for low back pain. Skin: Negative for rash. Neurological: Negative for headaches, focal weakness or numbness. ___________________________________________   PHYSICAL EXAM:  VITAL SIGNS: ED Triage Vitals  Enc Vitals Group     BP 12/11/18 1153 138/89     Pulse Rate 12/11/18 1153 (!) 109     Resp 12/11/18 1153 18     Temp 12/11/18 1153 98.5 F (36.9 C)     Temp Source 12/11/18 1153 Oral     SpO2 12/11/18 1153 96 %     Weight 12/11/18 1154 270 lb (122.5 kg)     Height 12/11/18 1154 5\' 9"  (1.753 m)     Head Circumference --      Peak Flow --      Pain Score 12/11/18 1154 8     Pain Loc --      Pain Edu? --      Excl. in GC? --    Constitutional: Alert and oriented. Well appearing and in no acute distress. Eyes: Conjunctivae are normal. PERRL. EOMI. Head: Atraumatic. Neck: No stridor.   Cardiovascular: Normal rate, regular rhythm. Grossly normal heart sounds.  Good peripheral circulation. Respiratory: Normal respiratory effort.  No retractions. Lungs CTAB. Gastrointestinal: Soft and nontender. No distention.  Obese. Musculoskeletal:  On examination of the back there is no gross deformity.  There is no tenderness on palpation of thoracic spine.  There is tenderness on palpation of the vertebral bodies approximately L4-S1 area.  Paravertebral muscles to the right arm moderately tender.  Patient is guarding with movement secondary to pain.  No discoloration or abrasions were seen. Neurologic:  Normal speech and language. No gross focal neurologic deficits are appreciated. No gait instability. Skin:  Skin is warm, dry and intact.  Psychiatric: Mood and affect are normal. Speech and behavior are normal.  ____________________________________________   LABS (all labs ordered are listed, but only abnormal results are displayed)  Labs  Reviewed - No data to display  RADIOLOGY  Official radiology report(s): Dg Lumbar Spine 2-3 Views  Result Date: 12/11/2018 CLINICAL DATA:  Low back pain today while getting out of chair. EXAM: LUMBAR SPINE - 2-3 VIEW COMPARISON:  09/06/2012 FINDINGS: Vertebral body alignment is normal. Very minimal spondylosis of the thoracolumbar spine with facet arthropathy over the mid to lower lumbar spine. Disc space narrowing at the L4-5 level. Mild chronic anterior wedging of T11. IMPRESSION: No acute findings. Mild spondylosis of the lumbar spine with mild disc space narrowing at the L4-5 level. Chronic stable anterior wedging of T11. Electronically Signed   By: Marin Olp M.D.   On: 12/11/2018 14:07    ____________________________________________   PROCEDURES  Procedure(s) performed (including Critical Care):  Procedures   ____________________________________________   INITIAL IMPRESSION / ASSESSMENT AND PLAN / ED COURSE  As part of my medical decision making, I reviewed the following data within the electronic MEDICAL RECORD NUMBER Notes from prior ED visits and  Controlled Substance Database  38 year old male presents to the ED with complaint of low back pain mostly on the right when he got up from his chair today.  He states it felt like a pulling sensation since that time he has been a unable to get comfortable.  He states he has had problems with his back in the past.  He was given pain medication and a muscle relaxant prior to his x-rays.  He states that his pain is improved.  He was reassured that his x-rays did not show any acute bony injury.  Patient was discharged with a prescription for Norco every 6 hours as needed for pain and methocarbamol 1 or 2 tablets every 6 hours as needed for muscle spasms.  He is to follow-up with his PCP or return to the ED if any severe worsening of his symptoms.  ____________________________________________   FINAL CLINICAL IMPRESSION(S) / ED DIAGNOSES   Final diagnoses:  Strain of lumbar region, initial encounter     ED Discharge Orders         Ordered    HYDROcodone-acetaminophen (NORCO/VICODIN) 5-325 MG tablet  Every 6 hours PRN     12/11/18 1433    methocarbamol (ROBAXIN) 500 MG tablet     12/11/18 1433           Note:  This document was prepared using Dragon voice recognition software and may include unintentional dictation errors.    Johnn Hai, PA-C 12/11/18 1602    Merlyn Lot, MD 12/12/18 [phone]

## 2018-12-11 NOTE — ED Triage Notes (Addendum)
Pt to ED with c/o of lower back pain that started this morning while getting out of a chair. Pt has had similar pain in the past a "few time". Pt states after the incident he had a "panic attack".

## 2018-12-11 NOTE — Discharge Instructions (Signed)
Follow-up with your primary care provider if any continued problems.  Use ice or heat to your back as needed for discomfort.  Also prescription for pain and a muscle relaxant was sent to your pharmacy.  Do not take these medications while you are working or driving as it could cause drowsiness and increase your risk for injury.  You may also take ibuprofen with these 2 medications if additional pain medication is needed.

## 2019-07-26 ENCOUNTER — Emergency Department
Admission: EM | Admit: 2019-07-26 | Discharge: 2019-07-26 | Disposition: A | Discharge status: Home / Self Care | Payer: Medicaid - Out of State | Attending: Student | Admitting: Student

## 2019-07-26 ENCOUNTER — Other Ambulatory Visit: Payer: Self-pay

## 2019-07-26 ENCOUNTER — Emergency Department: Payer: Medicaid - Out of State

## 2019-07-26 DIAGNOSIS — R1013 Epigastric pain: Secondary | ICD-10-CM | POA: Diagnosis present

## 2019-07-26 DIAGNOSIS — R0789 Other chest pain: Secondary | ICD-10-CM | POA: Diagnosis not present

## 2019-07-26 DIAGNOSIS — K219 Gastro-esophageal reflux disease without esophagitis: Secondary | ICD-10-CM | POA: Diagnosis not present

## 2019-07-26 LAB — COMPREHENSIVE METABOLIC PANEL
ALT: 20 U/L (ref 0–44)
AST: 15 U/L (ref 15–41)
Albumin: 3.9 g/dL (ref 3.5–5.0)
Alkaline Phosphatase: 56 U/L (ref 38–126)
Anion gap: 7 (ref 5–15)
BUN: 14 mg/dL (ref 6–20)
CO2: 28 mmol/L (ref 22–32)
Calcium: 9.2 mg/dL (ref 8.9–10.3)
Chloride: 101 mmol/L (ref 98–111)
Creatinine, Ser: 1.06 mg/dL (ref 0.61–1.24)
GFR calc Af Amer: >60 mL/min (ref >60)
GFR calc non Af Amer: >60 mL/min (ref >60)
Glucose, Bld: 169 mg/dL — ABNORMAL HIGH (ref 70–99)
Potassium: 4.4 mmol/L (ref 3.5–5.1)
Sodium: 136 mmol/L (ref 135–145)
Total Bilirubin: 0.6 mg/dL (ref 0.3–1.2)
Total Protein: 7.5 g/dL (ref 6.5–8.1)

## 2019-07-26 LAB — CBC WITH DIFFERENTIAL/PLATELET
Abs Immature Granulocytes: 0.04 10*3/uL (ref 0.00–0.07)
Basophils Absolute: 0.1 10*3/uL (ref 0.0–0.1)
Basophils Relative: 1 %
Eosinophils Absolute: 0.3 10*3/uL (ref 0.0–0.5)
Eosinophils Relative: 3 %
HCT: 43.1 % (ref 39.0–52.0)
Hemoglobin: 14.6 g/dL (ref 13.0–17.0)
Immature Granulocytes: 0 %
Lymphocytes Relative: 24 %
Lymphs Abs: 2.5 10*3/uL (ref 0.7–4.0)
MCH: 30.7 pg (ref 26.0–34.0)
MCHC: 33.9 g/dL (ref 30.0–36.0)
MCV: 90.5 fL (ref 80.0–100.0)
Monocytes Absolute: 0.5 10*3/uL (ref 0.1–1.0)
Monocytes Relative: 5 %
Neutro Abs: 6.7 10*3/uL (ref 1.7–7.7)
Neutrophils Relative %: 67 %
Platelets: 209 10*3/uL (ref 150–400)
RBC: 4.76 MIL/uL (ref 4.22–5.81)
RDW: 12.9 % (ref 11.5–15.5)
WBC: 10.1 10*3/uL (ref 4.0–10.5)
nRBC: 0 % (ref 0.0–0.2)

## 2019-07-26 LAB — LIPASE, BLOOD: Lipase: 39 U/L (ref 11–51)

## 2019-07-26 LAB — TROPONIN I (HIGH SENSITIVITY)
Troponin I (High Sensitivity): 2 ng/L (ref <18)
Troponin I (High Sensitivity): <2 ng/L (ref <18)

## 2019-07-26 MED ORDER — PANTOPRAZOLE SODIUM 40 MG IV SOLR
40.0000 mg | Freq: Once | INTRAVENOUS | Status: AC
  Administered 2019-07-26: 40 mg via INTRAVENOUS
  Filled 2019-07-26: qty 40

## 2019-07-26 MED ORDER — LIDOCAINE VISCOUS HCL 2 % MT SOLN
15.0000 mL | Freq: Once | OROMUCOSAL | Status: AC
  Administered 2019-07-26: 15 mL via ORAL
  Filled 2019-07-26: qty 15

## 2019-07-26 MED ORDER — ALUM & MAG HYDROXIDE-SIMETH 200-200-20 MG/5ML PO SUSP
30.0000 mL | Freq: Once | ORAL | Status: AC
  Administered 2019-07-26: 30 mL via ORAL
  Filled 2019-07-26: qty 30

## 2019-07-26 MED ORDER — FAMOTIDINE 20 MG PO TABS: 20.0000 mg | ORAL_TABLET | Freq: Two times a day (BID) | ORAL | 2 refills | Status: AC

## 2019-07-26 NOTE — ED Provider Notes (Signed)
[facility] Emergency Department Provider Note  ____________________________________________   First MD Initiated Contact with Patient 07/26/19 [phone]     (approximate)  I have reviewed the triage vital signs and the nursing notes.  History  Chief Complaint Chest Pain, Back Pain, and Abdominal Pain    HPI Mark Hinton is a 39 y.o. male with a history of gastritis, GERD who presents emergency department for lower chest discomfort, epigastric pain.  Patient states symptoms first started around 5 AM.  Located in the epigastrium and lower chest area.  Does feel like the pain radiates to the back as well.  Describes it as a burning type sensation.  6/10 in severity.  Symptoms feel similar to prior episodes of gastritis/GERD.  Tried eating a Hot Pocket at home to see if it would relieve any hunger pains without significant improvement.  Took Tums at home without significant relief.  No associated nausea, vomiting, diarrhea.  No diaphoresis or shortness of breath.  Denies any heavy NSAID or alcohol use.  Was previously on medication for GERD, not taking any currently.  No personal history of cardiac disease.   Past Medical Hx Past Medical History:  Diagnosis Date  . Arthritis   . Bone spur    NECK  . Chronic back pain   . Gastritis     Problem List There are no problems to display for this patient.   Past Surgical Hx Past Surgical History:  Procedure Laterality Date  . ANTERIOR CERVICAL DECOMP/DISCECTOMY FUSION     C6 to C7    Medications Prior to Admission medications   Medication Sig Start Date End Date Taking? Authorizing Provider  HYDROcodone-acetaminophen (NORCO/VICODIN) 5-325 MG tablet Take 1 tablet by mouth every 6 (six) hours as needed for moderate pain. 12/11/18   Johnn Hai, PA-C  methocarbamol (ROBAXIN) 500 MG tablet 1 or 2 tablets every 6 hours as needed for muscle spasms 12/11/18   Letitia Neri L, PA-C  sertraline (ZOLOFT) 50 MG tablet  Take 50 mg by mouth daily.    [provider]    Allergies Patient has no known allergies.  Family Hx No family history on file.  Social Hx Social History   Tobacco Use  . Smoking status: Current Every Day Smoker    Packs/day: 0.50    Types: Cigarettes  . Smokeless tobacco: Never Used  Substance Use Topics  . Alcohol use: No  . Drug use: Not Currently     Review of Systems  Constitutional: Negative for fever. Negative for chills. Eyes: Negative for visual changes. ENT: Negative for sore throat. Cardiovascular: Positive for chest pain. Respiratory: Negative for shortness of breath. Gastrointestinal: Negative for nausea. Negative for vomiting.  Positive for indigestion. Genitourinary: Negative for dysuria. Musculoskeletal: Negative for leg swelling. Skin: Negative for rash. Neurological: Negative for headaches.   Physical Exam  Vital Signs: ED Triage Vitals  Enc Vitals Group     BP 07/26/19 0822 138/82     Pulse Rate 07/26/19 0822 75     Resp 07/26/19 0822 (!) 22     Temp 07/26/19 0822 98 F (36.7 C)     Temp src --      SpO2 07/26/19 0822 100 %     Weight 07/26/19 0816 260 lb (117.9 kg)     Height 07/26/19 0816 5\' 9"  (1.753 m)     Head Circumference --      Peak Flow --      Pain Score 07/26/19 0816  7     Pain Loc --      Pain Edu? --      Excl. in GC? --     Constitutional: Alert and oriented. Well appearing. NAD.  Head: Normocephalic. Atraumatic. Eyes: Conjunctivae clear. Sclera anicteric. Pupils equal and symmetric. Nose: No masses or lesions. No congestion or rhinorrhea. Mouth/Throat: Wearing mask.  Neck: No stridor. Trachea midline.  Cardiovascular: Normal rate, regular rhythm. Extremities well perfused. Respiratory: Normal respiratory effort.  Lungs CTAB. Gastrointestinal: Soft. Non-distended. Non-tender.  Genitourinary: Deferred. Musculoskeletal: No lower extremity edema. No deformities. Neurologic:  Normal speech and language. No  gross focal or lateralizing neurologic deficits are appreciated.  Skin: Skin is warm, dry and intact. No rash noted. Psychiatric: Mood and affect are appropriate for situation.  EKG  Personally reviewed and interpreted by myself.   Date: 07/26/19 Time: 0822 Rate: 75 Rhythm: sinus Axis: normal Intervals: WNL TWI aVL No STEMI    Radiology  Personally reviewed available imaging myself.   CXR - IMPRESSION:  No active cardiopulmonary disease.    Procedures  Procedure(s) performed (including critical care):  Procedures   Initial Impression / Assessment and Plan / MDM / ED Course  39 y.o. male who presents to the ED for epigastric, lower chest discomfort.  Described as burning. Exam reassuring, normal cardiopulmonary exam and abdomen soft and NT  Ddx: presentation seems most consistent with GERD/gastritis, also consider pancreatitis, atypical ACS.  Equal and symmetric distal pulses, no associated neurological symptoms, and age make acute aortic pathology doubtful.  Will plan for labs, EKG, CXR, PO GERD treatment and reassess.  Clinical Course as of Jul 25 1849  Wed Jul 26, 2019  1146 Troponin x 2 negative.  Lipase within normal limits.  LFTs and bilirubin within normal limits as well.  As such, feel patient is stable for discharge with outpatient follow-up.  Will provide Rx for famotidine, information on GERD friendly diet, and given return precautions.   [SM]    Clinical Course User Index [SM] Miguel Aschoff., MD     _______________________________   As part of my medical decision making I have reviewed available labs, radiology tests, reviewed old records/performed chart review.    Final Clinical Impression(s) / ED Diagnosis  Final diagnoses:  Epigastric burning sensation       Note:  This document was prepared using Dragon voice recognition software and may include unintentional dictation errors.   Miguel Aschoff., MD 07/26/19 [phone]

## 2019-07-26 NOTE — ED Triage Notes (Signed)
Pt comes via POV from home with c/o epigastric pain, chest pain and back pain. Pt states he was awoken from his sleep with the back pain.  Pt states once he got up he felt the pain in his chest and abdomen. Pt states he tried taking some Tums and eating something with no relief.  Pt states it could be heart burn but not sure.  Pt denies any N/V/D.

## 2019-07-26 NOTE — ED Notes (Signed)
RN Lorrie informed of pt in room

## 2019-07-26 NOTE — Discharge Instructions (Addendum)
Thank you for letting us take care of you in the emergency department today.   Please continue to take any regular, prescribed medications.   New medications we have prescribed:  Famotidine, take as directed  Please follow up with: Your primary care doctor to review your ER visit and follow up on your symptoms.    Please return to the ER for any new or worsening symptoms.

## 2019-08-31 ENCOUNTER — Emergency Department
Admission: EM | Admit: 2019-08-31 | Discharge: 2019-08-31 | Disposition: A | Discharge status: Home / Self Care | Payer: Medicaid - Out of State | Attending: Emergency Medicine | Admitting: Emergency Medicine

## 2019-08-31 ENCOUNTER — Other Ambulatory Visit: Payer: Self-pay

## 2019-08-31 DIAGNOSIS — F17210 Nicotine dependence, cigarettes, uncomplicated: Secondary | ICD-10-CM | POA: Diagnosis not present

## 2019-08-31 DIAGNOSIS — K2900 Acute gastritis without bleeding: Secondary | ICD-10-CM | POA: Insufficient documentation

## 2019-08-31 DIAGNOSIS — Z79899 Other long term (current) drug therapy: Secondary | ICD-10-CM | POA: Insufficient documentation

## 2019-08-31 DIAGNOSIS — R1013 Epigastric pain: Secondary | ICD-10-CM | POA: Diagnosis present

## 2019-08-31 LAB — COMPREHENSIVE METABOLIC PANEL
ALT: 25 U/L (ref 0–44)
AST: 16 U/L (ref 15–41)
Albumin: 3.8 g/dL (ref 3.5–5.0)
Alkaline Phosphatase: 54 U/L (ref 38–126)
Anion gap: 9 (ref 5–15)
BUN: 10 mg/dL (ref 6–20)
CO2: 27 mmol/L (ref 22–32)
Calcium: 9.5 mg/dL (ref 8.9–10.3)
Chloride: 99 mmol/L (ref 98–111)
Creatinine, Ser: 1.16 mg/dL (ref 0.61–1.24)
GFR calc Af Amer: >60 mL/min (ref >60)
GFR calc non Af Amer: >60 mL/min (ref >60)
Glucose, Bld: 182 mg/dL — ABNORMAL HIGH (ref 70–99)
Potassium: 3.8 mmol/L (ref 3.5–5.1)
Sodium: 135 mmol/L (ref 135–145)
Total Bilirubin: 0.5 mg/dL (ref 0.3–1.2)
Total Protein: 7.2 g/dL (ref 6.5–8.1)

## 2019-08-31 LAB — CBC
HCT: 41.9 % (ref 39.0–52.0)
Hemoglobin: 14.1 g/dL (ref 13.0–17.0)
MCH: 30.1 pg (ref 26.0–34.0)
MCHC: 33.7 g/dL (ref 30.0–36.0)
MCV: 89.3 fL (ref 80.0–100.0)
Platelets: 211 10*3/uL (ref 150–400)
RBC: 4.69 MIL/uL (ref 4.22–5.81)
RDW: 12.6 % (ref 11.5–15.5)
WBC: 11.9 10*3/uL — ABNORMAL HIGH (ref 4.0–10.5)
nRBC: 0 % (ref 0.0–0.2)

## 2019-08-31 LAB — TROPONIN I (HIGH SENSITIVITY): Troponin I (High Sensitivity): 3 ng/L (ref <18)

## 2019-08-31 LAB — LIPASE, BLOOD: Lipase: 40 U/L (ref 11–51)

## 2019-08-31 MED ORDER — ALUM & MAG HYDROXIDE-SIMETH 200-200-20 MG/5ML PO SUSP
30.0000 mL | Freq: Once | ORAL | Status: AC
  Administered 2019-08-31: 30 mL via ORAL
  Filled 2019-08-31: qty 30

## 2019-08-31 MED ORDER — SODIUM CHLORIDE 0.9% FLUSH: 3.0000 mL | Freq: Once | INTRAVENOUS | Status: DC

## 2019-08-31 MED ORDER — LIDOCAINE VISCOUS HCL 2 % MT SOLN
15.0000 mL | Freq: Once | OROMUCOSAL | Status: AC
  Administered 2019-08-31: 15 mL via ORAL
  Filled 2019-08-31: qty 15

## 2019-08-31 NOTE — ED Triage Notes (Signed)
Pt c/o LUQ pain that radiating into the chest since last night, denies N/V/D/SOb or other sx at this time, pt is in NAD.

## 2019-08-31 NOTE — ED Provider Notes (Signed)
[facility] Emergency Department Provider Note   ____________________________________________    I have reviewed the triage vital signs and the nursing notes.   HISTORY  Chief Complaint Abdominal Pain     HPI Mark Hinton is a 39 y.o. male who presents with complaints of epigastric abdominal pain which she describes as burning in nature.  He reports his symptoms feel similar to prior episodes of acid reflux.  He has taken Pepcid intermittently but does not take it regularly.  Has been taking Tums with little relief.  He reports he had [employer] yesterday prior to symptoms beginning.  Denies chest pain or shortness of breath.  No nausea or vomiting.  No fevers or chills or cough.  Past Medical History:  Diagnosis Date  . Arthritis   . Bone spur    NECK  . Chronic back pain   . Gastritis     There are no problems to display for this patient.   Past Surgical History:  Procedure Laterality Date  . ANTERIOR CERVICAL DECOMP/DISCECTOMY FUSION     C6 to C7    Prior to Admission medications   Medication Sig Start Date End Date Taking? Authorizing Provider  famotidine (PEPCID) 20 MG tablet Take 1 tablet (20 mg total) by mouth 2 (two) times daily. 07/26/19 08/25/19  Lilia Pro., MD  HYDROcodone-acetaminophen (NORCO/VICODIN) 5-325 MG tablet Take 1 tablet by mouth every 6 (six) hours as needed for moderate pain. 12/11/18   Johnn Hai, PA-C  methocarbamol (ROBAXIN) 500 MG tablet 1 or 2 tablets every 6 hours as needed for muscle spasms 12/11/18   Letitia Neri L, PA-C  sertraline (ZOLOFT) 50 MG tablet Take 50 mg by mouth daily.    [provider]     Allergies Patient has no known allergies.  No family history on file.  Social History Social History   Tobacco Use  . Smoking status: Current Every Day Smoker    Packs/day: 0.50    Types: Cigarettes  . Smokeless tobacco: Never Used  Substance Use Topics  . Alcohol use: No  . Drug  use: Not Currently    Review of Systems  Constitutional: No fever/chills Eyes: No visual changes.  ENT: No sore throat. Cardiovascular: Denies chest pain. Respiratory: As above Gastrointestinal: As above Genitourinary: Negative for dysuria. Musculoskeletal: Negative for back pain. Skin: Negative for rash. Neurological: Negative for headaches    ____________________________________________   PHYSICAL EXAM:  VITAL SIGNS: ED Triage Vitals  Enc Vitals Group     BP 08/31/19 0713 135/89     Pulse Rate 08/31/19 0713 82     Resp 08/31/19 0713 16     Temp 08/31/19 0713 98.4 F (36.9 C)     Temp Source 08/31/19 0713 Oral     SpO2 08/31/19 0713 95 %     Weight 08/31/19 0712 117.9 kg (260 lb)     Height 08/31/19 0712 1.753 m (5\' 9" )     Head Circumference --      Peak Flow --      Pain Score 08/31/19 0716 8     Pain Loc --      Pain Edu? --      Excl. in [place]? --     Constitutional: Alert and oriented.   Head: Atraumatic.  Mouth/Throat: Mucous membranes are moist.   Neck:  Painless ROM Cardiovascular: Normal rate, regular rhythm. Grossly normal heart sounds.  Good peripheral circulation. Respiratory: Normal respiratory effort.  No  retractions. Lungs CTAB. Gastrointestinal: Soft and nontender. No distention.  No CVA tenderness.  Musculoskeletal:  Warm and well perfused Neurologic:  Normal speech and language. No gross focal neurologic deficits are appreciated.  Skin:  Skin is warm, dry and intact. No rash noted. Psychiatric: Mood and affect are normal. Speech and behavior are normal.  ____________________________________________   LABS (all labs ordered are listed, but only abnormal results are displayed)  Labs Reviewed  COMPREHENSIVE METABOLIC PANEL - Abnormal; Notable for the following components:      Result Value   Glucose, Bld 182 (*)    All other components within normal limits  CBC - Abnormal; Notable for the following components:   WBC 11.9 (*)    All  other components within normal limits  LIPASE, BLOOD  URINALYSIS, COMPLETE (UACMP) WITH MICROSCOPIC  TROPONIN I (HIGH SENSITIVITY)  TROPONIN I (HIGH SENSITIVITY)   ____________________________________________  EKG  ED ECG REPORT I, Jene Every, the attending physician, personally viewed and interpreted this ECG.  Date: 08/31/2019  Rhythm: normal sinus rhythm QRS Axis: normal Intervals: normal ST/T Wave abnormalities: normal Narrative Interpretation: no evidence of acute ischemia  ____________________________________________  RADIOLOGY  None ____________________________________________   PROCEDURES  Procedure(s) performed: No  Procedures   Critical Care performed: No ____________________________________________   INITIAL IMPRESSION / ASSESSMENT AND PLAN / ED COURSE  Pertinent labs & imaging results that were available during my care of the patient were reviewed by me and considered in my medical decision making (see chart for details).  Patient presents with epigastric burning sensation as described above, consistent with prior episodes of acid reflux.  Strongly suspicious of gastritis/GERD, less likely peptic ulcer disease, patient with intermittent use of Pepcid/Tums.  Lab work here today is quite reassuring, EKG is normal.  Troponin is normal.  Patient had total relief of discomfort with GI cocktail, recommended Protonix to the patient however he would like to try taking his Pepcid more regularly, has follow-up with his PCP, return precautions discussed   ____________________________________________   FINAL CLINICAL IMPRESSION(S) / ED DIAGNOSES  Final diagnoses:  Acute gastritis without hemorrhage, unspecified gastritis type        Note:  This document was prepared using Dragon voice recognition software and may include unintentional dictation errors.   Jene Every, MD 08/31/19 [phone]

## 2019-08-31 NOTE — ED Notes (Signed)
Pt verbalizes understanding of d/c instructions and follow up. 

## 2019-08-31 NOTE — ED Notes (Signed)
Pt reports last pm started with a burning pain to his abd that radiated into his chest and to his back. Pt reports had similar episode in the past. Pt denies NVD.

## 2019-10-27 ENCOUNTER — Emergency Department
Admission: EM | Admit: 2019-10-27 | Discharge: 2019-10-27 | Disposition: A | Discharge status: Home / Self Care | Payer: Medicaid - Out of State | Attending: Emergency Medicine | Admitting: Emergency Medicine

## 2019-10-27 ENCOUNTER — Other Ambulatory Visit: Payer: Self-pay

## 2019-10-27 ENCOUNTER — Encounter: Payer: Self-pay | Admitting: Emergency Medicine

## 2019-10-27 DIAGNOSIS — E119 Type 2 diabetes mellitus without complications: Secondary | ICD-10-CM | POA: Insufficient documentation

## 2019-10-27 DIAGNOSIS — K047 Periapical abscess without sinus: Secondary | ICD-10-CM | POA: Diagnosis not present

## 2019-10-27 DIAGNOSIS — F17210 Nicotine dependence, cigarettes, uncomplicated: Secondary | ICD-10-CM | POA: Diagnosis not present

## 2019-10-27 DIAGNOSIS — K0889 Other specified disorders of teeth and supporting structures: Secondary | ICD-10-CM | POA: Diagnosis present

## 2019-10-27 HISTORY — DX: Type 2 diabetes mellitus without complications: E11.9

## 2019-10-27 MED ORDER — TRAMADOL HCL 50 MG PO TABS: 50.0000 mg | ORAL_TABLET | Freq: Four times a day (QID) | ORAL | 0 refills | Status: DC | PRN

## 2019-10-27 MED ORDER — AMOXICILLIN 500 MG PO CAPS
500.0000 mg | ORAL_CAPSULE | Freq: Three times a day (TID) | ORAL | 0 refills | Status: DC
Start: 2019-10-27 — End: 2020-09-16

## 2019-10-27 MED ORDER — LIDOCAINE VISCOUS HCL 2 % MT SOLN
5.0000 mL | Freq: Four times a day (QID) | OROMUCOSAL | 0 refills | Status: DC | PRN
Start: 2019-10-27 — End: 2020-09-16

## 2019-10-27 MED ORDER — NAPROXEN 500 MG PO TABS
500.0000 mg | ORAL_TABLET | Freq: Two times a day (BID) | ORAL | 0 refills | Status: DC
Start: 2019-10-27 — End: 2020-09-16

## 2019-10-27 NOTE — ED Notes (Signed)
See triage note  Presents with dental pain possible abscess area  Swelling noted to right upper gumline

## 2019-10-27 NOTE — ED Triage Notes (Deleted)
No answer for triage.

## 2019-10-27 NOTE — ED Triage Notes (Addendum)
First nurse note- here for dental pain and facial swelling. NAD

## 2019-10-27 NOTE — Discharge Instructions (Signed)
Follow discharge care instructions and seek dental care from list of clinics provided in your discharge care instructions. OPTIONS FOR DENTAL FOLLOW UP CARE  [place] [facility] and [facility] - Local Safety Net Dental Clinics [email or web address]   [facility] [phone])  [facility] Ma [phone])  [facility] [phone])  [facility] [phone])  [facility] [phone]) This clinic caters to the indigent population and is on a lottery system. Location: [employer] [facility], [employer], [address], [place] Clinic Hours: Wednesdays from 6pm - 9pm, patients seen by a lottery system. For dates, call or go to [email or web address] Services: Cleanings, fillings and simple extractions. Payment Options: DENTAL WORK IS FREE OF CHARGE. Bring proof of income or support. Best way to get seen: Arrive at 5:15 pm - this is a lottery, NOT first come/first serve, so arriving earlier will not increase your chances of being seen.     [facility] [phone] Select option 1 for emergencies   Location: [facility], [place], [address] [place] Clinic Hours: No walk-ins accepted - call the day before to schedule an appointment. Check in times are 9:30 am and 1:30 pm. Services: Simple extractions, temporary fillings, pulpectomy/pulp debridement, uncomplicated abscess drainage. Payment Options: PAYMENT IS DUE AT THE TIME OF SERVICE.  Fee is usually $100-200, additional surgical procedures (e.g. abscess drainage) may be extra. Cash, checks, Visa/MasterCard accepted.  Can file Medicaid if patient is covered for dental - patient should call case worker to check. No discount for [facility] patients. Best way to get seen: MUST call the day before and get onto the  schedule. Can usually be seen the next 1-2 days. No walk-ins accepted.     [facility] [phone]   Location: [facility], [address] [place] Clinic Hours: M, W, Th, F 8am or 1:30pm, Tues 9a or 1:30 - first come/first served. Services: Simple extractions, temporary fillings, uncomplicated abscess drainage.  You do not need to be an [facility] resident. Payment Options: PAYMENT IS DUE AT THE TIME OF SERVICE. Dental insurance, otherwise sliding scale - bring proof of income or support. Depending on income and treatment needed, cost is usually $50-200. Best way to get seen: Arrive early as it is first come/first served.     [facility] [phone]   Location: [address] Clinic Hours: Mon-Thu 8a-5p Services: Most basic dental services including extractions and fillings. Payment Options: PAYMENT IS DUE AT THE TIME OF SERVICE. Sliding scale, up to 50% off - bring proof if income or support. Medicaid with dental option accepted. Best way to get seen: Call to schedule an appointment, can usually be seen within 2 weeks OR they will try to see walk-ins - show up at 8a or 2p (you may have to wait).     [facility] [phone] [place] RESIDENTS ONLY   Location: [facility], [address] [place], [place] [address] Clinic Hours: By appointment only. Monday - Thursday 8am-5pm, Friday 8am-12pm Services: Cleanings, fillings, extractions. Payment Options: PAYMENT IS DUE AT THE TIME OF SERVICE. Cash, Visa or MasterCard. Sliding scale - $30 minimum per service. Best way to get seen: Come in to office, complete packet and make an appointment - need proof of income or support monies for each household member and proof of [facility] residence. Usually takes about a month to  get in.     [facility] [phone]    Location: [address], [place] Clinic Hours: Walk-in Urgent Care Dental Services are offered Monday-Friday mornings only. The numbers of emergencies accepted daily is limited to the number of providers available. Maximum 15 - Mondays, Wednesdays & Thursdays Maximum 10 - Tuesdays & Fridays Services: You do not need to be a [facility] resident to be seen for a dental emergency. Emergencies are defined as pain, swelling, abnormal bleeding, or dental trauma. Walkins will receive x-rays if needed. NOTE: Dental cleaning is not an emergency. Payment Options: PAYMENT IS DUE AT THE TIME OF SERVICE. Minimum co-pay is $40.00 for uninsured patients. Minimum co-pay is $3.00 for Medicaid with dental coverage. Dental Insurance is accepted and must be presented at time of visit. Medicare does not cover dental. Forms of payment: Cash, credit card, checks. Best way to get seen: If not previously registered with the clinic, walk-in dental registration begins at 7:15 am and is on a first come/first serve basis. If previously registered with the clinic, call to make an appointment.     [facility] [place] ONLY   Location: [address] [place], [place] Clinic Hours: Mon-Thu 10a-2p Services: Extractions only! Payment Options: FREE (donations accepted) - bring proof of income or support Best way to get seen: Call and schedule an appointment OR come at 8am on the 1st Monday of every month (except for holidays) when it is first come/first served.     [facility] [phone]   Location: [place], [place] Clinic Hours: Friday mornings Services, Payment Options, Best way to get seen: Call for info

## 2019-10-27 NOTE — ED Triage Notes (Signed)
Pain to right upper tooth.  Mild swelling to right face. No fever.

## 2019-10-27 NOTE — ED Provider Notes (Signed)
[facility] Emergency Department Provider Note   ____________________________________________   First MD Initiated Contact with Patient 10/27/19 1133     (approximate)  I have reviewed the triage vital signs and the nursing notes.   HISTORY  Chief Complaint Dental Pain    HPI Mark Hinton is a 39 y.o. male patient presents with dental pain secondary to long history of devitalized teeth and gingivitis.  Patient has not seen a dentist.  Denies fever or drainage.  Rates pain as 8/10.  No palliative measure for complaint.         Past Medical History:  Diagnosis Date  . Arthritis   . Bone spur    NECK  . Chronic back pain   . Diabetes mellitus without complication (HCC)   . Gastritis     There are no problems to display for this patient.   Past Surgical History:  Procedure Laterality Date  . ANTERIOR CERVICAL DECOMP/DISCECTOMY FUSION     C6 to C7    Prior to Admission medications   Medication Sig Start Date End Date Taking? Authorizing Provider  amoxicillin (AMOXIL) 500 MG capsule Take 1 capsule (500 mg total) by mouth 3 (three) times daily. 10/27/19   Joni Reining, PA-C  famotidine (PEPCID) 20 MG tablet Take 1 tablet (20 mg total) by mouth 2 (two) times daily. 07/26/19 08/25/19  Miguel Aschoff., MD  lidocaine (XYLOCAINE) 2 % solution Use as directed 5 mLs in the mouth or throat every 6 (six) hours as needed for mouth pain. 10/27/19   Joni Reining, PA-C  naproxen (NAPROSYN) 500 MG tablet Take 1 tablet (500 mg total) by mouth 2 (two) times daily with a meal. 10/27/19   Joni Reining, PA-C  sertraline (ZOLOFT) 50 MG tablet Take 50 mg by mouth daily.    [provider]  traMADol (ULTRAM) 50 MG tablet Take 1 tablet (50 mg total) by mouth every 6 (six) hours as needed. 10/27/19 10/26/20  Joni Reining, PA-C    Allergies Patient has no known allergies.  History reviewed. No pertinent family history.  Social History Social History    Tobacco Use  . Smoking status: Current Every Day Smoker    Packs/day: 0.50    Types: Cigarettes  . Smokeless tobacco: Never Used  Substance Use Topics  . Alcohol use: No  . Drug use: Not Currently    Review of Systems Constitutional: No fever/chills Eyes: No visual changes. ENT: No sore throat. Cardiovascular: Denies chest pain. Respiratory: Denies shortness of breath. Gastrointestinal: No abdominal pain.  No nausea, no vomiting.  No diarrhea.  No constipation. Genitourinary: Negative for dysuria. Musculoskeletal: Chronic back pain.  Skin: Negative for rash. Neurological: Negative for headaches, focal weakness or numbness. Endocrine:  Diabetes.  ____________________________________________   PHYSICAL EXAM:  VITAL SIGNS: ED Triage Vitals  Enc Vitals Group     BP 10/27/19 1035 125/84     Pulse Rate 10/27/19 1035 96     Resp 10/27/19 1035 18     Temp 10/27/19 1035 98.4 F (36.9 C)     Temp Source 10/27/19 1035 Oral     SpO2 10/27/19 1035 96 %     Weight 10/27/19 1029 (!) 270 lb (122.5 kg)     Height 10/27/19 1029 5\' 9"  (1.753 m)     Head Circumference --      Peak Flow --      Pain Score 10/27/19 1029 8     Pain Loc --  Pain Edu? --      Excl. in GC? --    Constitutional: Alert and oriented. Well appearing and in no acute distress. Mouth/Throat: Mucous membranes are moist.  Oropharynx non-erythematous.  Gingiva with edema and multiple devitalized teeth. Neck: No stridor.  Hematological/Lymphatic/Immunilogical: No cervical lymphadenopathy. Cardiovascular: Normal rate, regular rhythm. Grossly normal heart sounds.  Good peripheral circulation. Respiratory: Normal respiratory effort.  No retractions. Lungs CTAB. Musculoskeletal: No lower extremity tenderness nor edema.  No joint effusions. Neurologic:  Normal speech and language. No gross focal neurologic deficits are appreciated. No gait instability. Skin:  Skin is warm, dry and intact. No rash  noted. Psychiatric: Mood and affect are normal. Speech and behavior are normal.  ____________________________________________   LABS (all labs ordered are listed, but only abnormal results are displayed)  Labs Reviewed - No data to display ____________________________________________  EKG   ____________________________________________  RADIOLOGY  ED MD interpretation:    Official radiology report(s): No results found.  ____________________________________________   PROCEDURES  Procedure(s) performed (including Critical Care):  Procedures   ____________________________________________   INITIAL IMPRESSION / ASSESSMENT AND PLAN / ED COURSE  As part of my medical decision making, I reviewed the following data within the electronic MEDICAL RECORD NUMBER     Patient presents with dental pain secondary to multiple devitalized teeth and gingivitis.  Patient given discharge care instruction advised take medication directed.  Patient advised establish care for list of dental clinics provided discharge care instructions.    Dmani Mizer was evaluated in Emergency Department on 10/27/2019 for the symptoms described in the history of present illness. He was evaluated in the context of the global COVID-19 pandemic, which necessitated consideration that the patient might be at risk for infection with the SARS-CoV-2 virus that causes COVID-19. Institutional protocols and algorithms that pertain to the evaluation of patients at risk for COVID-19 are in a state of rapid change based on information released by regulatory bodies including the CDC and federal and state organizations. These policies and algorithms were followed during the patient's care in the ED.       ____________________________________________   FINAL CLINICAL IMPRESSION(S) / ED DIAGNOSES  Final diagnoses:  Dental infection     ED Discharge Orders         Ordered    lidocaine (XYLOCAINE) 2 % solution  Every 6 hours  PRN     Discontinue  Reprint     10/27/19 1139    amoxicillin (AMOXIL) 500 MG capsule  3 times daily     Discontinue  Reprint     10/27/19 1139    naproxen (NAPROSYN) 500 MG tablet  2 times daily with meals     Discontinue  Reprint     10/27/19 1139    traMADol (ULTRAM) 50 MG tablet  Every 6 hours PRN     Discontinue  Reprint     10/27/19 1139           Note:  This document was prepared using Dragon voice recognition software and may include unintentional dictation errors.    Joni Reining, PA-C 10/27/19 1144    Sharyn Creamer, MD 10/27/19 1655

## 2019-11-10 ENCOUNTER — Emergency Department
Admission: EM | Admit: 2019-11-10 | Discharge: 2019-11-10 | Disposition: A | Discharge status: Home / Self Care | Payer: Medicaid - Out of State | Attending: Emergency Medicine | Admitting: Emergency Medicine

## 2019-11-10 ENCOUNTER — Other Ambulatory Visit: Payer: Self-pay

## 2019-11-10 ENCOUNTER — Encounter: Payer: Self-pay | Admitting: Emergency Medicine

## 2019-11-10 ENCOUNTER — Emergency Department: Payer: Medicaid - Out of State

## 2019-11-10 DIAGNOSIS — R072 Precordial pain: Secondary | ICD-10-CM | POA: Diagnosis present

## 2019-11-10 DIAGNOSIS — R109 Unspecified abdominal pain: Secondary | ICD-10-CM | POA: Insufficient documentation

## 2019-11-10 DIAGNOSIS — Z5321 Procedure and treatment not carried out due to patient leaving prior to being seen by health care provider: Secondary | ICD-10-CM | POA: Insufficient documentation

## 2019-11-10 LAB — COMPREHENSIVE METABOLIC PANEL
ALT: 30 U/L (ref 0–44)
AST: 17 U/L (ref 15–41)
Albumin: 4 g/dL (ref 3.5–5.0)
Alkaline Phosphatase: 52 U/L (ref 38–126)
Anion gap: 10 (ref 5–15)
BUN: 13 mg/dL (ref 6–20)
CO2: 24 mmol/L (ref 22–32)
Calcium: 9.7 mg/dL (ref 8.9–10.3)
Chloride: 102 mmol/L (ref 98–111)
Creatinine, Ser: 1.13 mg/dL (ref 0.61–1.24)
GFR calc Af Amer: >60 mL/min (ref >60)
GFR calc non Af Amer: >60 mL/min (ref >60)
Glucose, Bld: 134 mg/dL — ABNORMAL HIGH (ref 70–99)
Potassium: 3.8 mmol/L (ref 3.5–5.1)
Sodium: 136 mmol/L (ref 135–145)
Total Bilirubin: 0.6 mg/dL (ref 0.3–1.2)
Total Protein: 7.6 g/dL (ref 6.5–8.1)

## 2019-11-10 LAB — TROPONIN I (HIGH SENSITIVITY): Troponin I (High Sensitivity): <2 ng/L (ref <18)

## 2019-11-10 LAB — CBC WITH DIFFERENTIAL/PLATELET
Abs Immature Granulocytes: 0.04 10*3/uL (ref 0.00–0.07)
Basophils Absolute: 0.1 10*3/uL (ref 0.0–0.1)
Basophils Relative: 1 %
Eosinophils Absolute: 0.3 10*3/uL (ref 0.0–0.5)
Eosinophils Relative: 3 %
HCT: 41.3 % (ref 39.0–52.0)
Hemoglobin: 14.1 g/dL (ref 13.0–17.0)
Immature Granulocytes: 0 %
Lymphocytes Relative: 28 %
Lymphs Abs: 3.3 10*3/uL (ref 0.7–4.0)
MCH: 31 pg (ref 26.0–34.0)
MCHC: 34.1 g/dL (ref 30.0–36.0)
MCV: 90.8 fL (ref 80.0–100.0)
Monocytes Absolute: 0.7 10*3/uL (ref 0.1–1.0)
Monocytes Relative: 6 %
Neutro Abs: 7.4 10*3/uL (ref 1.7–7.7)
Neutrophils Relative %: 62 %
Platelets: 221 10*3/uL (ref 150–400)
RBC: 4.55 MIL/uL (ref 4.22–5.81)
RDW: 12.9 % (ref 11.5–15.5)
WBC: 11.8 10*3/uL — ABNORMAL HIGH (ref 4.0–10.5)
nRBC: 0 % (ref 0.0–0.2)

## 2019-11-10 LAB — LIPASE, BLOOD: Lipase: 41 U/L (ref 11–51)

## 2019-11-10 NOTE — ED Triage Notes (Signed)
Patient ambulatory to triage with steady gait, without difficulty or distress noted; pt reports since last night having midsternal CP radiating thru to back with some abd pain; st took some tums and lidocaine with some relief of abd pain

## 2020-09-16 ENCOUNTER — Other Ambulatory Visit: Payer: Self-pay

## 2020-09-16 ENCOUNTER — Encounter: Payer: Self-pay | Admitting: Emergency Medicine

## 2020-09-16 ENCOUNTER — Emergency Department
Admission: EM | Admit: 2020-09-16 | Discharge: 2020-09-16 | Disposition: A | Discharge status: Home / Self Care | Payer: Medicaid - Out of State | Attending: Emergency Medicine | Admitting: Emergency Medicine

## 2020-09-16 DIAGNOSIS — F17210 Nicotine dependence, cigarettes, uncomplicated: Secondary | ICD-10-CM | POA: Insufficient documentation

## 2020-09-16 DIAGNOSIS — H9202 Otalgia, left ear: Secondary | ICD-10-CM

## 2020-09-16 DIAGNOSIS — E119 Type 2 diabetes mellitus without complications: Secondary | ICD-10-CM | POA: Insufficient documentation

## 2020-09-16 DIAGNOSIS — H6012 Cellulitis of left external ear: Secondary | ICD-10-CM | POA: Insufficient documentation

## 2020-09-16 MED ORDER — DOXYCYCLINE HYCLATE 50 MG PO CAPS
100.0000 mg | ORAL_CAPSULE | Freq: Two times a day (BID) | ORAL | 0 refills | Status: AC
Start: 2020-09-16 — End: 2020-09-21

## 2020-09-16 NOTE — ED Notes (Signed)
See triage note  Presents with pain to left ear  States pain started on Friday  Noticed swelling at that time worse today  No fever

## 2020-09-16 NOTE — ED Triage Notes (Signed)
Pt reports a little pain and some swelling to left ear since Friday. Pt reports since then the swelling has gotten worse.

## 2020-09-16 NOTE — ED Provider Notes (Signed)
[facility] Emergency Department Provider Note   ____________________________________________   Event Date/Time   First MD Initiated Contact with Patient 09/16/20 1620     (approximate)  I have reviewed the triage vital signs and the nursing notes.   HISTORY  Chief Complaint Otalgia    HPI Mark Hinton is a 40 y.o. male with the below stated past medical history who presents for pain and swelling behind the left ear.  Patient states that yesterday he began having some pain and felt some swelling between the auricle and his scalp on the left side that is now worsening with worsening swelling, worsening pain, and radiation of this pain down into the left neck.  Patient denies any similar symptoms to this in the past.  Patient describes throbbing, 3/10 pain behind the left auricle that is worsened with manipulation of the ear or palpation and has no relieving factors.  Patient currently denies any vision changes, tinnitus, difficulty speaking, facial droop, sore throat, chest pain, shortness of breath, abdominal pain, nausea/vomiting/diarrhea, dysuria, or weakness/numbness/paresthesias in any extremity         Past Medical History:  Diagnosis Date   Arthritis    Bone spur    NECK   Chronic back pain    Diabetes mellitus without complication (HCC)    Gastritis     There are no problems to display for this patient.   Past Surgical History:  Procedure Laterality Date   ANTERIOR CERVICAL DECOMP/DISCECTOMY FUSION     C6 to C7    Prior to Admission medications   Medication Sig Start Date End Date Taking? Authorizing Provider  doxycycline (VIBRAMYCIN) 50 MG capsule Take 2 capsules (100 mg total) by mouth 2 (two) times daily for 5 days. 09/16/20 09/21/20 Yes Merwyn Katos, MD  famotidine (PEPCID) 20 MG tablet Take 1 tablet (20 mg total) by mouth 2 (two) times daily. 07/26/19 08/25/19  Miguel Aschoff., MD  sertraline (ZOLOFT) 50 MG tablet Take 50 mg by mouth  daily.    [provider]    Allergies Patient has no known allergies.  No family history on file.  Social History Social History   Tobacco Use   Smoking status: Every Day    Packs/day: 0.50    Pack years: 0.00    Types: Cigarettes   Smokeless tobacco: Never  Vaping Use   Vaping Use: Never used  Substance Use Topics   Alcohol use: No   Drug use: Not Currently    Review of Systems Constitutional: No fever/chills Eyes: No visual changes. ENT: No sore throat. Cardiovascular: Denies chest pain. Respiratory: Denies shortness of breath. Gastrointestinal: No abdominal pain.  No nausea, no vomiting.  No diarrhea. Genitourinary: Negative for dysuria. Musculoskeletal: Negative for acute arthralgias Skin: Swelling, erythema, and tenderness to palpation in the postauricular area of the left ear Neurological: Negative for headaches, weakness/numbness/paresthesias in any extremity Psychiatric: Negative for suicidal ideation/homicidal ideation   ____________________________________________   PHYSICAL EXAM:  VITAL SIGNS: ED Triage Vitals  Enc Vitals Group     BP 09/16/20 1537 118/78     Pulse Rate 09/16/20 1537 (!) 105     Resp 09/16/20 1537 18     Temp 09/16/20 1537 98.1 F (36.7 C)     Temp Source 09/16/20 1537 Oral     SpO2 09/16/20 1537 96 %     Weight 09/16/20 1528 245 lb (111.1 kg)     Height 09/16/20 1528 5\' 9"  (1.753 m)  Head Circumference --      Peak Flow --      Pain Score 09/16/20 1528 3     Pain Loc --      Pain Edu? --      Excl. in GC? --    Constitutional: Alert and oriented. Well appearing and in no acute distress. Eyes: Conjunctivae are normal. PERRL. Head: Atraumatic.  Erythema, induration, and tenderness to palpation in the postauricular area on the left with clear tympanic membrane and without erythema in the external auditory canal.  Hearing intact and no mastoid tenderness Nose: No congestion/rhinnorhea. Mouth/Throat: Mucous  membranes are moist. Neck: No stridor.  Left anterior cervical lymphadenopathy Cardiovascular: Grossly normal heart sounds.  Good peripheral circulation. Respiratory: Normal respiratory effort.  No retractions. Gastrointestinal: Soft and nontender. No distention. Musculoskeletal: No obvious deformities Neurologic:  Normal speech and language. No gross focal neurologic deficits are appreciated. Skin:  Skin is warm and dry. No rash noted. Psychiatric: Mood and affect are normal. Speech and behavior are normal.  ____________________________________________   LABS (all labs ordered are listed, but only abnormal results are displayed)  Labs Reviewed - No data to display  PROCEDURES  Procedure(s) performed (including Critical Care):  Procedures   ____________________________________________   INITIAL IMPRESSION / ASSESSMENT AND PLAN / ED COURSE  As part of my medical decision making, I reviewed the following data within the electronic MEDICAL RECORD NUMBER Nursing notes reviewed and incorporated, Labs reviewed, EKG interpreted, Old chart reviewed, Radiograph reviewed and Notes from prior ED visits reviewed and incorporated        Presentation most consistent with simple cellulitis in the left postauricular area Given History, Exam, and Workup I have low suspicion for Necrotizing Fasciitis, Abscess, Osteomyelitis, DVT, mastoiditis or other emergent problem as a cause for this presentation.  Rx: Doxycycline 100 mg twice daily x5 days  Disposition: Discharge. No evidence of serious bacterial illness. Nontoxic appearing, VSS. Low risk for treatment failure based on history. Strict return precautions discussed with patient with full understanding. Advised patient to follow up promptly with primary care provider within next 48 hours.      ____________________________________________   FINAL CLINICAL IMPRESSION(S) / ED DIAGNOSES  Final diagnoses:  Otalgia of left ear  Cellulitis of  left ear     ED Discharge Orders          Ordered    doxycycline (VIBRAMYCIN) 50 MG capsule  2 times daily        09/16/20 1627             Note:  This document was prepared using Dragon voice recognition software and may include unintentional dictation errors.    Merwyn Katos, MD 09/16/20 [phone]

## 2022-09-12 ENCOUNTER — Other Ambulatory Visit: Payer: Self-pay

## 2022-09-12 ENCOUNTER — Encounter: Payer: Self-pay | Admitting: Emergency Medicine

## 2022-09-12 ENCOUNTER — Emergency Department
Admission: EM | Admit: 2022-09-12 | Discharge: 2022-09-12 | Disposition: A | Discharge status: Home / Self Care | Payer: BC Managed Care – PPO | Attending: Emergency Medicine | Admitting: Emergency Medicine

## 2022-09-12 ENCOUNTER — Emergency Department: Payer: BC Managed Care – PPO

## 2022-09-12 DIAGNOSIS — W010XXA Fall on same level from slipping, tripping and stumbling without subsequent striking against object, initial encounter: Secondary | ICD-10-CM | POA: Diagnosis not present

## 2022-09-12 DIAGNOSIS — M533 Sacrococcygeal disorders, not elsewhere classified: Secondary | ICD-10-CM | POA: Insufficient documentation

## 2022-09-12 MED ORDER — KETOROLAC TROMETHAMINE 15 MG/ML IJ SOLN
15.0000 mg | Freq: Once | INTRAMUSCULAR | Status: AC
  Administered 2022-09-12: 15 mg via INTRAMUSCULAR
  Filled 2022-09-12: qty 1

## 2022-09-12 NOTE — ED Provider Notes (Signed)
[facility] Provider Note    Event Date/Time   First MD Initiated Contact with Patient 09/12/22 1542     (approximate)   History   Fall and Tailbone Pain   HPI  Mark Hinton is a 42 y.o. male who presents today for evaluation of tailbone pain.  Patient reports that he was working on his car yesterday and stood up and stepped backwards and tripped on his stool bag and landed on his tailbone.  He reports that it hurts when he sits and walks, though no pain when he is still.  He reports that he scraped his elbow as well but has no pain in this location.  He denies head strike or LOC.  He denies paresthesias in his legs or weakness in his legs.  No urinary fecal incontinence or retention.  There are no problems to display for this patient.         Physical Exam   Triage Vital Signs: ED Triage Vitals  Enc Vitals Group     BP 09/12/22 1451 128/79     Pulse Rate 09/12/22 1451 87     Resp 09/12/22 1451 18     Temp 09/12/22 1451 98.5 F (36.9 C)     Temp Source 09/12/22 1451 Oral     SpO2 09/12/22 1451 96 %     Weight 09/12/22 1448 255 lb (115.7 kg)     Height 09/12/22 1448 5\' 9"  (1.753 m)     Head Circumference --      Peak Flow --      Pain Score 09/12/22 1448 2     Pain Loc --      Pain Edu? --      Excl. in GC? --     Most recent vital signs: Vitals:   09/12/22 1451  BP: 128/79  Pulse: 87  Resp: 18  Temp: 98.5 F (36.9 C)  SpO2: 96%    Physical Exam Vitals and nursing note reviewed.  Constitutional:      General: Awake and alert. No acute distress.    Appearance: Normal appearance. The patient is obese.  HENT:     Head: Normocephalic and atraumatic.     Mouth: Mucous membranes are moist.  Eyes:     General: PERRL. Normal EOMs        Right eye: No discharge.        Left eye: No discharge.     Conjunctiva/sclera: Conjunctivae normal.  Cardiovascular:     Rate and Rhythm: Normal rate and regular rhythm.     Pulses: Normal pulses.   Pulmonary:     Effort: Pulmonary effort is normal. No respiratory distress.     Breath sounds: Normal breath sounds.  Abdominal:     Abdomen is soft. There is no abdominal tenderness. No rebound or guarding. No distention. Musculoskeletal:        General: No swelling. Normal range of motion.     Cervical back: Normal range of motion and neck supple.  Back: No midline tenderness. TTP to coccyx. Strength and sensation 5/5 to bilateral lower extremities. Normal great toe extension against resistance. Normal sensation throughout feet. Normal patellar reflexes. Negative SLR and opposite SLR bilaterally. Negative FABER test Skin:    General: Skin is warm and dry.     Capillary Refill: Capillary refill takes less than 2 seconds.     Findings: No rash.  Neurological:     Mental Status: The patient is awake and alert.  ED Results / Procedures / Treatments   Labs (all labs ordered are listed, but only abnormal results are displayed) Labs Reviewed - No data to display   EKG     RADIOLOGY I independently reviewed and interpreted imaging and agree with radiologists findings.     PROCEDURES:  Critical Care performed:   Procedures   MEDICATIONS ORDERED IN ED: Medications  ketorolac (TORADOL) 15 MG/ML injection 15 mg (15 mg Intramuscular Given 09/12/22 1602)     IMPRESSION / MDM / ASSESSMENT AND PLAN / ED COURSE  I reviewed the triage vital signs and the nursing notes.   Differential diagnosis includes, but is not limited to, contusion, fracture, radiculopathy.  Patient is awake and alert, hemodynamically stable and afebrile.  He has normal strength and sensation in his bilateral lower extremities.  No signs or symptoms of cord compression.  He is ambulatory with a steady gait.  He has not had any urinary/fecal incontinence or retention or saddle anesthesia.  He has tenderness in his coccyx area only, without skin changes.  He has no lumbar or thoracic tenderness.  There  is no head strike or LOC, no negation for CT head or neck per [place] criteria.  He was treated symptomatically with Toradol.  X-ray obtained and is negative for any fracture.  Patient is reassured by these findings.  I recommended that he sit on a doughnut pillow to help relieve pressure off of his coccyx.  We discussed Tylenol/Motrin.  We discussed return precautions and outpatient follow-up.  Patient understands plan.  He was discharged in stable condition.   Patient's presentation is most consistent with acute complicated illness / injury requiring diagnostic workup.      FINAL CLINICAL IMPRESSION(S) / ED DIAGNOSES   Final diagnoses:  Coccyalgia     Rx / DC Orders   ED Discharge Orders     None        Note:  This document was prepared using Dragon voice recognition software and may include unintentional dictation errors.   Keturah Shavers 09/12/22 1719    Georga Hacking, MD 09/12/22 [phone]

## 2022-09-12 NOTE — Discharge Instructions (Signed)
Your x-ray does not show any broken bones.  Please sit on doughnut pillows to relieve pressure off of your tailbone.  You may take Tylenol/ibuprofen per package instructions to help with your symptoms.  Please return for any new, worsening, or change in symptoms or other concerns.  It was a pleasure caring for you today.

## 2022-09-12 NOTE — ED Triage Notes (Signed)
Pt was working on car yesterday and stood up and stepped backwards trippped on tool bag and landed on tailbone. Tailbone hurts to sit, cough and sneeze. Also hasAbrasion to left elbow.

## 2022-11-20 ENCOUNTER — Encounter: Payer: Self-pay | Admitting: Emergency Medicine

## 2022-11-20 ENCOUNTER — Other Ambulatory Visit: Payer: Self-pay

## 2022-11-20 ENCOUNTER — Emergency Department
Admission: EM | Admit: 2022-11-20 | Discharge: 2022-11-20 | Disposition: A | Discharge status: Home / Self Care | Payer: BC Managed Care – PPO | Attending: Emergency Medicine | Admitting: Emergency Medicine

## 2022-11-20 DIAGNOSIS — K0889 Other specified disorders of teeth and supporting structures: Secondary | ICD-10-CM | POA: Diagnosis present

## 2022-11-20 DIAGNOSIS — K029 Dental caries, unspecified: Secondary | ICD-10-CM | POA: Diagnosis not present

## 2022-11-20 MED ORDER — HYDROCODONE-ACETAMINOPHEN 5-325 MG PO TABS: 1.0000 | ORAL_TABLET | Freq: Four times a day (QID) | ORAL | 0 refills | Status: DC | PRN

## 2022-11-20 MED ORDER — AMOXICILLIN 500 MG PO CAPS: 500.0000 mg | ORAL_CAPSULE | Freq: Three times a day (TID) | ORAL | 0 refills | Status: DC

## 2022-11-20 MED ORDER — AMOXICILLIN 500 MG PO CAPS
500.0000 mg | ORAL_CAPSULE | Freq: Once | ORAL | Status: AC
  Administered 2022-11-20: 500 mg via ORAL
  Filled 2022-11-20: qty 1

## 2022-11-20 MED ORDER — IBUPROFEN 800 MG PO TABS
800.0000 mg | ORAL_TABLET | Freq: Once | ORAL | Status: AC
  Administered 2022-11-20: 800 mg via ORAL
  Filled 2022-11-20: qty 1

## 2022-11-20 MED ORDER — LIDOCAINE VISCOUS HCL 2 % MT SOLN
15.0000 mL | Freq: Once | OROMUCOSAL | Status: AC
  Administered 2022-11-20: 15 mL via OROMUCOSAL
  Filled 2022-11-20: qty 15

## 2022-11-20 NOTE — ED Triage Notes (Signed)
Patient ambulatory to triage with steady gait, without difficulty or distress noted; pt reports rt sided dental pain today unrelieved by orajel and tylenol

## 2022-11-20 NOTE — ED Provider Notes (Signed)
[facility] Provider Note    Event Date/Time   First MD Initiated Contact with Patient 11/20/22 [phone]     (approximate)   History   Dental Pain   HPI  Mark Hinton is a 42 y.o. male who presents to the ED from home with a chief complaint of right-sided dental pain.  Patient has "bad teeth" and has been hurting in his right lower jaw for the past several days.  Pain unrelieved by Tylenol and Orajel.  States he was quoted $8000 by a dental clinic to "fix my teeth".  Denies fever/chills, facial swelling, nausea/vomiting or dizziness.     Past Medical History   Past Medical History:  Diagnosis Date   Arthritis    Bone spur    NECK   Chronic back pain    Diabetes mellitus without complication (HCC)    Gastritis      Active Problem List  There are no problems to display for this patient.    Past Surgical History   Past Surgical History:  Procedure Laterality Date   ANTERIOR CERVICAL DECOMP/DISCECTOMY FUSION     C6 to C7     Home Medications   Prior to Admission medications   Medication Sig Start Date End Date Taking? Authorizing Provider  amoxicillin (AMOXIL) 500 MG capsule Take 1 capsule (500 mg total) by mouth 3 (three) times daily. 11/20/22  Yes Irean Hong, MD  HYDROcodone-acetaminophen (NORCO) 5-325 MG tablet Take 1 tablet by mouth every 6 (six) hours as needed for moderate pain. 11/20/22  Yes Irean Hong, MD  famotidine (PEPCID) 20 MG tablet Take 1 tablet (20 mg total) by mouth 2 (two) times daily. 07/26/19 08/25/19  Miguel Aschoff., MD  sertraline (ZOLOFT) 50 MG tablet Take 50 mg by mouth daily.    [provider]     Allergies  Patient has no known allergies.   Family History  History reviewed. No pertinent family history.   Physical Exam  Triage Vital Signs: ED Triage Vitals  Encounter Vitals Group     BP 11/20/22 0104 (!) 149/97     Systolic BP Percentile --      Diastolic BP Percentile --      Pulse Rate  11/20/22 0104 70     Resp 11/20/22 0104 16     Temp 11/20/22 0104 98.3 F (36.8 C)     Temp Source 11/20/22 0104 Oral     SpO2 11/20/22 0104 98 %     Weight 11/20/22 0103 245 lb (111.1 kg)     Height 11/20/22 0103 5\' 9"  (1.753 m)     Head Circumference --      Peak Flow --      Pain Score 11/20/22 0103 8     Pain Loc --      Pain Education --      Exclude from Growth Chart --     Updated Vital Signs: BP (!) 149/97   Pulse 70   Temp 98.3 F (36.8 C) (Oral)   Resp 16   Ht 5\' 9"  (1.753 m)   Wt 111.1 kg   SpO2 98%   BMI 36.18 kg/m    General: Awake, no distress.  CV:  Good peripheral perfusion.  Resp:  Normal effort.  Abd:  No distention.  Other:  Widespread dental decay and multiple broken teeth.  Right lower incisor broken with decay and tender to palpation with tongue blade.  No intraoral/extraoral swelling.   ED  Results / Procedures / Treatments  Labs (all labs ordered are listed, but only abnormal results are displayed) Labs Reviewed - No data to display   EKG  None   RADIOLOGY None   Official radiology report(s): No results found.   PROCEDURES:  Critical Care performed: No  Procedures   MEDICATIONS ORDERED IN ED: Medications  lidocaine (XYLOCAINE) 2 % viscous mouth solution 15 mL (has no administration in time range)  ibuprofen (ADVIL) tablet 800 mg (has no administration in time range)  amoxicillin (AMOXIL) capsule 500 mg (has no administration in time range)     IMPRESSION / MDM / ASSESSMENT AND PLAN / ED COURSE  I reviewed the triage vital signs and the nursing notes.                             42 year old male presenting with dentalgia.  Will administer amoxicillin, lidocaine rinse, Motrin.  Discharged home with prescription for amoxicillin and Norco to use as needed.  List of dental clinics provided.  Strict return precautions given.  Patient verbalizes understanding and agrees with plan of care.  Patient's presentation is most  consistent with acute, uncomplicated illness.   FINAL CLINICAL IMPRESSION(S) / ED DIAGNOSES   Final diagnoses:  Dental caries  Dentalgia     Rx / DC Orders   ED Discharge Orders          Ordered    amoxicillin (AMOXIL) 500 MG capsule  3 times daily        11/20/22 0301    HYDROcodone-acetaminophen (NORCO) 5-325 MG tablet  Every 6 hours PRN        11/20/22 0301             Note:  This document was prepared using Dragon voice recognition software and may include unintentional dictation errors.   Irean Hong, MD 11/20/22 [phone]

## 2022-11-20 NOTE — Discharge Instructions (Signed)
1. Take antibiotic as prescribed (Amoxicillin 500mg  three times daily x 7 days). 2. Take Profen as needed for pain; Norco as needed for more severe pain. 3. Return to the ER for worsening symptoms, persistent vomiting, fever, difficulty breathing or other concerns. OPTIONS FOR DENTAL FOLLOW UP CARE  [place] [facility] - Local Safety Net Dental Clinics [email or web address]   [facility] [phone])  [facility] [phone])  [place] [phone])  [facility] [phone])  [facility] [phone]) This clinic caters to the indigent population and is on a lottery system. Location: [employer] [facility], [employer], [address], [place] Clinic Hours: Wednesdays from 6pm - 9pm, patients seen by a lottery system. For dates, call or go to [email or web address] Services: Cleanings, fillings and simple extractions. Payment Options: DENTAL WORK IS FREE OF CHARGE. Bring proof of income or support. Best way to get seen: Arrive at 5:15 pm - this is a lottery, NOT first come/first serve, so arriving earlier will not increase your chances of being seen.     [facility] [phone] Select option 1 for emergencies   Location: [facility], [place], [address] [place] Clinic Hours: No walk-ins accepted - call the day before to schedule an appointment. Check in times are 9:30 am and 1:30 pm. Services: Simple extractions, temporary fillings, pulpectomy/pulp debridement, uncomplicated abscess drainage. Payment Options: PAYMENT IS DUE AT THE TIME OF SERVICE.  Fee is usually $100-200, additional surgical procedures (e.g. abscess drainage) may be extra. Cash, checks, Visa/MasterCard accepted.  Can file Medicaid if patient is covered for dental - patient should  call case worker to check. No discount for [facility] patients. Best way to get seen: MUST call the day before and get onto the schedule. Can usually be seen the next 1-2 days. No walk-ins accepted.     [facility] [phone]   Location: [facility], [address] [place] Clinic Hours: M, W, Th, F 8am or 1:30pm, Tues 9a or 1:30 - first come/first served. Services: Simple extractions, temporary fillings, uncomplicated abscess drainage.  You do not need to be an [facility] resident. Payment Options: PAYMENT IS DUE AT THE TIME OF SERVICE. Dental insurance, otherwise sliding scale - bring proof of income or support. Depending on income and treatment needed, cost is usually $50-200. Best way to get seen: Arrive early as it is first come/first served.     [facility] [phone]   Location: [address] Clinic Hours: Mon-Thu 8a-5p Services: Most basic dental services including extractions and fillings. Payment Options: PAYMENT IS DUE AT THE TIME OF SERVICE. Sliding scale, up to 50% off - bring proof if income or support. Medicaid with dental option accepted. Best way to get seen: Call to schedule an appointment, can usually be seen within 2 weeks OR they will try to see walk-ins - show up at 8a or 2p (you may have to wait).     [facility] [phone] [place] RESIDENTS ONLY   Location: [facility], [address] [place], [place] [address] Clinic Hours: By appointment only. Monday - Thursday 8am-5pm, Friday 8am-12pm Services: Cleanings, fillings, extractions. Payment Options: PAYMENT IS DUE AT THE TIME OF SERVICE. Cash, Visa or MasterCard. Sliding scale - $30 minimum per service. Best way to get seen: Come in to office, complete packet and make an  appointment - need proof of income or support monies for each household member and  proof of [facility] residence. Usually takes about a month to get in.     [facility] [phone]   Location: [address] [facility] Clinic Hours: Walk-in Urgent Care Dental Services are offered Monday-Friday mornings only. The numbers of emergencies accepted daily is limited to the number of providers available. Maximum 15 - Mondays, Wednesdays & Thursdays Maximum 10 - Tuesdays & Fridays Services: You do not need to be a [facility] resident to be seen for a dental emergency. Emergencies are defined as pain, swelling, abnormal bleeding, or dental trauma. Walkins will receive x-rays if needed. NOTE: Dental cleaning is not an emergency. Payment Options: PAYMENT IS DUE AT THE TIME OF SERVICE. Minimum co-pay is $40.00 for uninsured patients. Minimum co-pay is $3.00 for Medicaid with dental coverage. Dental Insurance is accepted and must be presented at time of visit. Medicare does not cover dental. Forms of payment: Cash, credit card, checks. Best way to get seen: If not previously registered with the clinic, walk-in dental registration begins at 7:15 am and is on a first come/first serve basis. If previously registered with the clinic, call to make an appointment.     [facility] [phone] [place] RESIDENTS ONLY   Location: [address] [place], [place] Clinic Hours: Mon-Thu 10a-2p Services: Extractions only! Payment Options: FREE (donations accepted) - bring proof of income or support Best way to get seen: Call and schedule an appointment OR come at 8am on the 1st Monday of every month (except for holidays) when it is first come/first served.     [facility] [phone]   Location: [address] [place], [place] Clinic Hours: Friday mornings Services, Payment Options, Best way to get seen: Call for info

## 2023-07-10 ENCOUNTER — Emergency Department
Admission: EM | Admit: 2023-07-10 | Discharge: 2023-07-10 | Disposition: A | Discharge status: Home / Self Care | Payer: Self-pay | Attending: Emergency Medicine | Admitting: Emergency Medicine

## 2023-07-10 ENCOUNTER — Encounter: Payer: Self-pay | Admitting: Intensive Care

## 2023-07-10 ENCOUNTER — Other Ambulatory Visit: Payer: Self-pay

## 2023-07-10 DIAGNOSIS — E119 Type 2 diabetes mellitus without complications: Secondary | ICD-10-CM | POA: Insufficient documentation

## 2023-07-10 DIAGNOSIS — N341 Nonspecific urethritis: Secondary | ICD-10-CM | POA: Insufficient documentation

## 2023-07-10 LAB — URINALYSIS, ROUTINE W REFLEX MICROSCOPIC
Bacteria, UA: NONE SEEN
Bilirubin Urine: NEGATIVE
Glucose, UA: NEGATIVE mg/dL
Ketones, ur: NEGATIVE mg/dL
Nitrite: NEGATIVE
Protein, ur: NEGATIVE mg/dL
Specific Gravity, Urine: 1.019 (ref 1.005–1.030)
WBC, UA: >50 WBC/hpf (ref 0–5)
pH: 5 (ref 5.0–8.0)

## 2023-07-10 LAB — CHLAMYDIA/NGC RT PCR (ARMC ONLY)
Chlamydia Tr: NOT DETECTED
N gonorrhoeae: NOT DETECTED

## 2023-07-10 MED ORDER — PHENAZOPYRIDINE HCL 100 MG PO TABS
100.0000 mg | ORAL_TABLET | Freq: Once | ORAL | Status: AC
  Administered 2023-07-10: 100 mg via ORAL
  Filled 2023-07-10: qty 1

## 2023-07-10 MED ORDER — PHENAZOPYRIDINE HCL 100 MG PO TABS: 100.0000 mg | ORAL_TABLET | Freq: Three times a day (TID) | ORAL | 0 refills | Status: DC | PRN

## 2023-07-10 MED ORDER — FLUCONAZOLE 150 MG PO TABS: 150.0000 mg | ORAL_TABLET | Freq: Once | ORAL | 0 refills | Status: AC

## 2023-07-10 MED ORDER — METRONIDAZOLE 500 MG PO TABS: 500.0000 mg | ORAL_TABLET | Freq: Three times a day (TID) | ORAL | 0 refills | Status: AC

## 2023-07-10 NOTE — ED Provider Notes (Signed)
 [facility] Provider Note    Event Date/Time   First MD Initiated Contact with Patient 07/10/23 1624     (approximate)   History   Urinary Frequency    HPI  Mark Hinton is a 43 y.o. male   with a past medical history of diabetes, who presents to the ED complaining of dysuria, frequency, penile discharge that started 3 days ago.  Patient states before having urinary symptoms he had a rash in in the gland, pruritus that resolved.  Patient denies fever, nauseas, back pain.  Patient states he had a similar episode 2 years ago   Physical Exam   Triage Vital Signs: ED Triage Vitals  Encounter Vitals Group     BP 07/10/23 1608 131/88     Systolic BP Percentile --      Diastolic BP Percentile --      Pulse Rate 07/10/23 1608 97     Resp 07/10/23 1608 20     Temp 07/10/23 1608 98.3 F (36.8 C)     Temp src --      SpO2 07/10/23 1608 97 %     Weight 07/10/23 1616 245 lb (111.1 kg)     Height 07/10/23 1616 5\' 9"  (1.753 m)     Head Circumference --      Peak Flow --      Pain Score 07/10/23 1616 7     Pain Loc --      Pain Education --      Exclude from Growth Chart --     Most recent vital signs: Vitals:   07/10/23 1608  BP: 131/88  Pulse: 97  Resp: 20  Temp: 98.3 F (36.8 C)  SpO2: 97%     Constitutional: Alert, NAD. Able to speak in complete sentences without cough or dyspnea  Eyes: Conjunctivae are normal.  Head: Atraumatic. Nose: No congestion/rhinnorhea. Mouth/Throat: Mucous membranes are moist.   Neck: Painless ROM. Supple. No JVD, nodes, thyromegaly  Cardiovascular:   Good peripheral circulation.RRR no murmurs, gallops, rubs  Respiratory: Normal respiratory effort.  No retractions. Clear to auscultation bilaterally without wheezing or crackles  Gastrointestinal: Soft and nontender.  Musculoskeletal:  no deformity Genitourinary: Gland with mild erythema, no blisters no skin lesions.  No discharge.  Bilateral CVA negative Neurologic:   MAE spontaneously. No gross focal neurologic deficits are appreciated.  Skin:  Skin is warm, dry and intact. No rash noted. Psychiatric: Mood and affect are normal. Speech and behavior are normal.    ED Results / Procedures / Treatments   Labs (all labs ordered are listed, but only abnormal results are displayed) Labs Reviewed  URINALYSIS, ROUTINE W REFLEX MICROSCOPIC - Abnormal; Notable for the following components:      Result Value   Color, Urine YELLOW (*)    APPearance HAZY (*)    Hgb urine dipstick SMALL (*)    Leukocytes,Ua MODERATE (*)    All other components within normal limits  CHLAMYDIA/NGC RT PCR ([facility] ONLY)            URINE CULTURE     EKG     RADIOLOGY     PROCEDURES:  Critical Care performed:   Procedures   MEDICATIONS ORDERED IN ED: Medications  phenazopyridine (PYRIDIUM) tablet 100 mg (100 mg Oral Given 07/10/23 1824)   Clinical Course as of 07/10/23 1931  Sat Jul 10, 2023  1921 Chlamydia/NGC rt PCR ([facility] only) Negative [AE]  1921 Urinalysis, Routine w reflex microscopic -Urine, Clean  Catch(!) Hemoglobin positive, leukocytes moderate [AE]    Clinical Course User Index [AE] Awilda Lennox, PA-C    IMPRESSION / MDM / ASSESSMENT AND PLAN / ED COURSE  I reviewed the triage vital signs and the nursing notes.  Differential diagnosis includes, but is not limited to, candidiasis, chlamydia, UTI, STD, pyelonephritis.  Patient's presentation is most consistent with acute complicated illness / injury requiring diagnostic workup.   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 . Labs are  reassuring ruling out chlamydia and gonorrhea infections. I did review the patient's allergies and medications.During admission patient received Pyridium with improvement of the dysuria.  the patient is in stable and satisfactory condition for discharge home.  Knowing his history of diabetes and description of fungal infection I will start treatment  for fungal urethritis, and metronidazole to cover trichomonas.  Patient will be discharged home with prescriptions for fluconazole, metronidazole.  Patient does not have PCP or insurance I [facility].  Patient is to [facility] with [facility] as needed or otherwise directed. Patient is given ED precautions to return to the ED for any worsening or new symptoms. Discussed plan of care with patient, answered all of patient's questions, Patient agreeable to plan of care. Advised patient to take medications according to the instructions on the label. Discussed possible side effects of new medications. Patient verbalized understanding.    FINAL CLINICAL IMPRESSION(S) / ED DIAGNOSES   Final diagnoses:  Urethritis, nongonococcal     Rx / DC Orders   ED Discharge Orders          Ordered    phenazopyridine (PYRIDIUM) 100 MG tablet  3 times daily PRN        07/10/23 1929    fluconazole (DIFLUCAN) 150 MG tablet   Once        07/10/23 1929    metroNIDAZOLE (FLAGYL) 500 MG tablet  3 times daily        07/10/23 1929             Note:  This document was prepared using Dragon voice recognition software and may include unintentional dictation errors.   Awilda Lennox, PA-C 07/10/23 1931    Viviano Ground, MD 07/10/23 2017

## 2023-07-10 NOTE — ED Notes (Signed)
 See first nurse note, pt denies fevers or chills.

## 2023-07-10 NOTE — Discharge Instructions (Addendum)
 You have been diagnosed with fungal urethritis.  Please take fluconazole 1 tablet by mouth once for 1 dose.  You can take Pyridium 1 tablet by mouth 3 times daily as needed for pain.  Please take metronidazole 1 tablet by mouth every 8 hours after main meals for 7 days.

## 2023-07-10 NOTE — ED Triage Notes (Signed)
 Patient c/o pain during urination X3 days. Also reports he saw some spots on the tip of his penis that have now disappeared. Reports history fungal infection on penis.

## 2023-07-12 LAB — URINE CULTURE: Culture: NO GROWTH

## 2023-09-05 ENCOUNTER — Emergency Department: Payer: MEDICAID

## 2023-09-05 ENCOUNTER — Encounter: Payer: Self-pay | Admitting: Emergency Medicine

## 2023-09-05 ENCOUNTER — Other Ambulatory Visit: Payer: Self-pay

## 2023-09-05 ENCOUNTER — Observation Stay
Admission: EM | Admit: 2023-09-05 | Discharge: 2023-09-06 | Disposition: A | Discharge status: Home / Self Care | Payer: MEDICAID | Attending: Internal Medicine | Admitting: Internal Medicine

## 2023-09-05 DIAGNOSIS — K8050 Calculus of bile duct without cholangitis or cholecystitis without obstruction: Secondary | ICD-10-CM | POA: Diagnosis not present

## 2023-09-05 DIAGNOSIS — K222 Esophageal obstruction: Secondary | ICD-10-CM | POA: Diagnosis not present

## 2023-09-05 DIAGNOSIS — R7989 Other specified abnormal findings of blood chemistry: Secondary | ICD-10-CM | POA: Diagnosis not present

## 2023-09-05 DIAGNOSIS — R0789 Other chest pain: Secondary | ICD-10-CM

## 2023-09-05 DIAGNOSIS — R932 Abnormal findings on diagnostic imaging of liver and biliary tract: Secondary | ICD-10-CM | POA: Insufficient documentation

## 2023-09-05 DIAGNOSIS — R079 Chest pain, unspecified: Secondary | ICD-10-CM | POA: Diagnosis present

## 2023-09-05 DIAGNOSIS — E119 Type 2 diabetes mellitus without complications: Secondary | ICD-10-CM | POA: Diagnosis not present

## 2023-09-05 DIAGNOSIS — K8020 Calculus of gallbladder without cholecystitis without obstruction: Secondary | ICD-10-CM | POA: Diagnosis present

## 2023-09-05 DIAGNOSIS — K8070 Calculus of gallbladder and bile duct without cholecystitis without obstruction: Principal | ICD-10-CM

## 2023-09-05 DIAGNOSIS — R109 Unspecified abdominal pain: Secondary | ICD-10-CM

## 2023-09-05 LAB — LIPASE, BLOOD: Lipase: 51 U/L (ref 11–51)

## 2023-09-05 LAB — BASIC METABOLIC PANEL WITH GFR
Anion gap: 9 (ref 5–15)
BUN: 19 mg/dL (ref 6–20)
CO2: 28 mmol/L (ref 22–32)
Calcium: 9.6 mg/dL (ref 8.9–10.3)
Chloride: 98 mmol/L (ref 98–111)
Creatinine, Ser: 1.23 mg/dL (ref 0.61–1.24)
GFR, Estimated: >60 mL/min (ref >60)
Glucose, Bld: 123 mg/dL — ABNORMAL HIGH (ref 70–99)
Potassium: 3.8 mmol/L (ref 3.5–5.1)
Sodium: 135 mmol/L (ref 135–145)

## 2023-09-05 LAB — CBC
HCT: 43.8 % (ref 39.0–52.0)
Hemoglobin: 14.5 g/dL (ref 13.0–17.0)
MCH: 30.7 pg (ref 26.0–34.0)
MCHC: 33.1 g/dL (ref 30.0–36.0)
MCV: 92.8 fL (ref 80.0–100.0)
Platelets: 185 10*3/uL (ref 150–400)
RBC: 4.72 MIL/uL (ref 4.22–5.81)
RDW: 12.8 % (ref 11.5–15.5)
WBC: 9.2 10*3/uL (ref 4.0–10.5)
nRBC: 0 % (ref 0.0–0.2)

## 2023-09-05 LAB — TROPONIN I (HIGH SENSITIVITY)
Troponin I (High Sensitivity): 4 ng/L (ref <18)
Troponin I (High Sensitivity): 3 ng/L (ref <18)

## 2023-09-05 LAB — GLUCOSE, CAPILLARY
Glucose-Capillary: 108 mg/dL — ABNORMAL HIGH (ref 70–99)
Glucose-Capillary: 119 mg/dL — ABNORMAL HIGH (ref 70–99)
Glucose-Capillary: 101 mg/dL — ABNORMAL HIGH (ref 70–99)

## 2023-09-05 LAB — HEPATIC FUNCTION PANEL
ALT: 277 U/L — ABNORMAL HIGH (ref 0–44)
AST: 140 U/L — ABNORMAL HIGH (ref 15–41)
Albumin: 4.1 g/dL (ref 3.5–5.0)
Alkaline Phosphatase: 66 U/L (ref 38–126)
Bilirubin, Direct: 1.1 mg/dL — ABNORMAL HIGH (ref 0.0–0.2)
Indirect Bilirubin: 1.6 mg/dL — ABNORMAL HIGH (ref 0.3–0.9)
Total Bilirubin: 2.7 mg/dL — ABNORMAL HIGH (ref 0.0–1.2)
Total Protein: 7.3 g/dL (ref 6.5–8.1)

## 2023-09-05 LAB — URINALYSIS, ROUTINE W REFLEX MICROSCOPIC
Bacteria, UA: NONE SEEN
Bilirubin Urine: NEGATIVE
Glucose, UA: NEGATIVE mg/dL
Ketones, ur: NEGATIVE mg/dL
Leukocytes,Ua: NEGATIVE
Nitrite: NEGATIVE
Protein, ur: NEGATIVE mg/dL
Specific Gravity, Urine: 1.018 (ref 1.005–1.030)
pH: 6 (ref 5.0–8.0)

## 2023-09-05 LAB — HEPATITIS PANEL, ACUTE
HCV Ab: NONREACTIVE
Hep A IgM: NONREACTIVE
Hep B C IgM: NONREACTIVE
Hepatitis B Surface Ag: NONREACTIVE

## 2023-09-05 MED ORDER — OXYCODONE HCL 5 MG PO TABS
5.0000 mg | ORAL_TABLET | ORAL | Status: DC | PRN
  Administered 2023-09-05 – 2023-09-06 (×3): 5 mg via ORAL
  Filled 2023-09-05 (×3): qty 1

## 2023-09-05 MED ORDER — ALUM & MAG HYDROXIDE-SIMETH 200-200-20 MG/5ML PO SUSP
30.0000 mL | Freq: Once | ORAL | Status: AC
  Administered 2023-09-05: 30 mL via ORAL
  Filled 2023-09-05: qty 30

## 2023-09-05 MED ORDER — MORPHINE SULFATE (PF) 4 MG/ML IV SOLN
4.0000 mg | Freq: Once | INTRAVENOUS | Status: AC
  Administered 2023-09-05: 4 mg via INTRAVENOUS
  Filled 2023-09-05: qty 1

## 2023-09-05 MED ORDER — ACETAMINOPHEN 325 MG PO TABS: 650.0000 mg | ORAL_TABLET | Freq: Four times a day (QID) | ORAL | Status: DC | PRN

## 2023-09-05 MED ORDER — LIDOCAINE VISCOUS HCL 2 % MT SOLN
15.0000 mL | Freq: Once | OROMUCOSAL | Status: AC
  Administered 2023-09-05: 15 mL via OROMUCOSAL
  Filled 2023-09-05: qty 15

## 2023-09-05 MED ORDER — ONDANSETRON HCL 4 MG/2ML IJ SOLN
4.0000 mg | Freq: Once | INTRAMUSCULAR | Status: AC
  Administered 2023-09-05: 4 mg via INTRAVENOUS
  Filled 2023-09-05: qty 2

## 2023-09-05 MED ORDER — PANTOPRAZOLE SODIUM 40 MG PO TBEC
40.0000 mg | DELAYED_RELEASE_TABLET | Freq: Once | ORAL | Status: AC
  Administered 2023-09-05: 40 mg via ORAL
  Filled 2023-09-05: qty 1

## 2023-09-05 MED ORDER — SERTRALINE HCL 50 MG PO TABS: 50.0000 mg | ORAL_TABLET | Freq: Every day | ORAL | Status: DC

## 2023-09-05 MED ORDER — HYDROMORPHONE HCL 1 MG/ML IJ SOLN
0.5000 mg | INTRAMUSCULAR | Status: AC | PRN
  Administered 2023-09-05: 0.5 mg via INTRAVENOUS
  Filled 2023-09-05: qty 0.5

## 2023-09-05 MED ORDER — GADOBUTROL 1 MMOL/ML IV SOLN
10.0000 mL | Freq: Once | INTRAVENOUS | Status: AC | PRN
  Administered 2023-09-05: 10 mL via INTRAVENOUS

## 2023-09-05 MED ORDER — ONDANSETRON HCL 4 MG/2ML IJ SOLN
4.0000 mg | Freq: Three times a day (TID) | INTRAMUSCULAR | Status: AC | PRN
  Administered 2023-09-05: 4 mg via INTRAVENOUS
  Filled 2023-09-05: qty 2

## 2023-09-05 MED ORDER — FAMOTIDINE 20 MG PO TABS: 20.0000 mg | ORAL_TABLET | Freq: Two times a day (BID) | ORAL | Status: DC

## 2023-09-05 MED ORDER — INSULIN ASPART 100 UNIT/ML IJ SOLN: 0.0000 [IU] | Freq: Three times a day (TID) | INTRAMUSCULAR | Status: DC

## 2023-09-05 MED ORDER — SODIUM CHLORIDE 0.9 % IV BOLUS (SEPSIS)
1000.0000 mL | Freq: Once | INTRAVENOUS | Status: AC
  Administered 2023-09-05: 1000 mL via INTRAVENOUS

## 2023-09-05 MED ORDER — NICOTINE 21 MG/24HR TD PT24
21.0000 mg | MEDICATED_PATCH | Freq: Once | TRANSDERMAL | Status: AC
  Administered 2023-09-05: 21 mg via TRANSDERMAL
  Filled 2023-09-05: qty 1

## 2023-09-05 MED ORDER — FAMOTIDINE 20 MG PO TABS: 20.0000 mg | ORAL_TABLET | Freq: Two times a day (BID) | ORAL | Status: DC | PRN

## 2023-09-05 MED ORDER — ACETAMINOPHEN 650 MG RE SUPP
650.0000 mg | Freq: Four times a day (QID) | RECTAL | Status: DC | PRN
Start: 2023-09-05 — End: 2023-09-06

## 2023-09-05 NOTE — ED Triage Notes (Signed)
 Arrived pov for chest pain, back pain, and abd pain  Per pt "I have gastritis and I think that is what is going on. I have been hurting for a couple of days, but I woke up at 1100  and have been eating tums, pepcid , and viscus lidocaine  with no relief."

## 2023-09-05 NOTE — Assessment & Plan Note (Signed)
 The patient takes metformin 500 mg bid for his diabetes. This will be held as he may require dye for procedures to evaluate his abdominal pain. His glucoses will be followed with FSBS and SSI.

## 2023-09-05 NOTE — Assessment & Plan Note (Signed)
 Right upper quadrant US  demonstrated cholelithiasis, but MRCP was equivocal on Choledocolithiasis.  Gastroenterology has been consulted. It is suggested that Dr. Ole Berkeley, who would be available on Monday could do an ERCP to further investigate the diagnosis and the source of the patient's pain.

## 2023-09-05 NOTE — Assessment & Plan Note (Signed)
 The patient is complaining of epigastric pain that is worse after eating. He cannot specify that his pain is worse after a fatty meal or not. He denies vomiting, although he states he has been nauseated. No fevers or chills. DDx include chole docolithiasis, acalculous cholecystitis, pancreatitis, gastritis, gastric ulcer, and duodenal ulcer. The patient has been treated for gastritis in the past, although he has never had an EGD. He states that this pain is much worse and more acute than the pain for which he was given this diagnosis.

## 2023-09-05 NOTE — ED Notes (Signed)
Ward MD at bedside. 

## 2023-09-05 NOTE — Consult Note (Signed)
 [facility] GI Inpatient Consult Note   Mark Hinton, M.D.  Reason for Consult: Abdominal pain, nausea, elevated liver enzymes   Attending Requesting Consult: Ava Swayze, DO   History of Present Illness: Mark Hinton is a 43 y.o. male who presented to the emergency room last night with a 3-day complaint of epigastric pain and nausea.  Patient characterizes the pain as "pressure and stabbing" without radiation to the back.  Patient remarks history of GERD for which she previously took Prilosec but had stopped it.  He does have recurrent symptoms of waterbrash and retrosternal chest burning on a daily basis.  For some reason, he tried to go back on a proton pump inhibitor but this seemed to cause his pain to be worse.  It was noted last evening that the patient had markedly elevated liver associated enzymes and abdominal ultrasound showed gallstones with a normal common bile duct of 4 mm.  An MRCP was performed, however, and showed somewhat of an artifactual presence of a distal common bile duct density, possibly common bile duct stones.  Liver associated enzymes appear to be elevated and an obstructive picture suggestive of extrahepatic biliary obstruction. Patient complains of mild to moderate dysphagia to solids and liquids.  This is rare but can occur a few times a month.  He denies hematemesis, loss of appetite.  Past Medical History:  Past Medical History:  Diagnosis Date   Arthritis    Bone spur    NECK   Chronic back pain    Diabetes mellitus without complication (HCC)    Gastritis     Problem List: Patient Active Problem List   Diagnosis Date Noted   Choledocholithiasis 09/05/2023   Cholelithiasis 09/05/2023   Abdominal pain 09/05/2023   DM II (diabetes mellitus, type II), controlled (HCC) 09/05/2023    Past Surgical History: Past Surgical History:  Procedure Laterality Date   ANTERIOR CERVICAL DECOMP/DISCECTOMY FUSION     C6 to C7    Allergies: No Known  Allergies  Home Medications: Medications Prior to Admission  Medication Sig Dispense Refill Last Dose/Taking   acetaminophen  (TYLENOL ) 500 MG tablet Take 500 mg by mouth every 6 (six) hours as needed for mild pain (pain score 1-3), fever or headache.   09/04/2023 Evening   calcium carbonate (TUMS - DOSED IN MG ELEMENTAL CALCIUM) 500 MG chewable tablet Chew 1 tablet by mouth daily.   09/04/2023 at 11:00 PM   famotidine  (PEPCID ) 20 MG tablet Take 1 tablet (20 mg total) by mouth 2 (two) times daily. 60 tablet 2 09/04/2023 at  5:00 PM   metFORMIN (GLUCOPHAGE) 500 MG tablet Take 500 mg by mouth 2 (two) times daily with a meal.   09/04/2023 Morning   amoxicillin  (AMOXIL ) 500 MG capsule Take 1 capsule (500 mg total) by mouth 3 (three) times daily. (Patient not taking: Reported on 09/05/2023) 21 capsule 0 Not Taking   HYDROcodone -acetaminophen  (NORCO) 5-325 MG tablet Take 1 tablet by mouth every 6 (six) hours as needed for moderate pain. (Patient not taking: Reported on 09/05/2023) 15 tablet 0 Not Taking   phenazopyridine  (PYRIDIUM ) 100 MG tablet Take 1 tablet (100 mg total) by mouth 3 (three) times daily as needed for pain. (Patient not taking: Reported on 09/05/2023) 20 tablet 0 Not Taking   sertraline (ZOLOFT) 50 MG tablet Take 50 mg by mouth daily. (Patient not taking: Reported on 09/05/2023)   Not Taking   Home medication reconciliation was completed with the patient.   Scheduled  Inpatient Medications:    famotidine   20 mg Oral BID   insulin aspart  0-9 Units Subcutaneous TID WC   nicotine  21 mg Transdermal Once    Continuous Inpatient Infusions:    PRN Inpatient Medications:  acetaminophen  **OR** acetaminophen , HYDROmorphone (DILAUDID) injection, ondansetron (ZOFRAN) IV, oxyCODONE   Family History: family history is not on file.   GI Family History: Negative   Social History:   reports that he has been smoking cigarettes. He has never used smokeless tobacco. He reports that he does not currently use  drugs. He reports that he does not drink alcohol. The patient denies ETOH, tobacco, or drug use.    Review of Systems: Review of Systems - Negative except HPI  Physical Examination: BP 112/84 (BP Location: Right Arm)   Pulse 73   Temp 98.1 F (36.7 C)   Resp 18   Ht 5\' 9"  (1.753 m)   Wt 117.9 kg   SpO2 96%   BMI 38.40 kg/m  Physical Exam Constitutional:      Appearance: He is well-developed. He is not ill-appearing or diaphoretic.  HENT:     Head: Normocephalic and atraumatic.  Eyes:     Pupils: Pupils are equal, round, and reactive to light.  Cardiovascular:     Rate and Rhythm: Normal rate. No extrasystoles are present.    Heart sounds: Normal heart sounds. No murmur heard.    No gallop.  Pulmonary:     Breath sounds: Normal breath sounds.  Chest:     Chest wall: No tenderness or edema.  Abdominal:     General: There is no abdominal bruit.     Palpations: Abdomen is soft. There is no fluid wave.     Tenderness: There is abdominal tenderness in the right upper quadrant and epigastric area. There is no guarding.  Skin:    General: Skin is warm and dry.     Capillary Refill: Capillary refill takes less than 2 seconds.  Neurological:     General: No focal deficit present.     Mental Status: He is alert and oriented to person, place, and time.     Data: Lab Results  Component Value Date   WBC 9.2 09/05/2023   HGB 14.5 09/05/2023   HCT 43.8 09/05/2023   MCV 92.8 09/05/2023   PLT 185 09/05/2023   Recent Labs  Lab 09/05/23 0056  HGB 14.5   Lab Results  Component Value Date   NA 135 09/05/2023   K 3.8 09/05/2023   CL 98 09/05/2023   CO2 28 09/05/2023   BUN 19 09/05/2023   CREATININE 1.23 09/05/2023   Lab Results  Component Value Date   ALT 277 (H) 09/05/2023   AST 140 (H) 09/05/2023   ALKPHOS 66 09/05/2023   BILITOT 2.7 (H) 09/05/2023   No results for input(s): "APTT", "INR", "PTT" in the last 168 hours.    Latest Ref Rng & Units 09/05/2023   12:56  AM 11/10/2019    4:21 AM 08/31/2019    7:15 AM  CBC  WBC 4.0 - 10.5 K/uL 9.2  11.8  11.9   Hemoglobin 13.0 - 17.0 g/dL 16.1  09.6  04.5   Hematocrit 39.0 - 52.0 % 43.8  41.3  41.9   Platelets 150 - 400 K/uL 185  221  211     STUDIES: MR ABDOMEN MRCP W WO CONTAST Result Date: 09/05/2023 CLINICAL DATA:  Gallstones with right upper quadrant pain EXAM: MRI ABDOMEN WITHOUT AND WITH CONTRAST (  INCLUDING MRCP) TECHNIQUE: Multiplanar multisequence MR imaging of the abdomen was performed both before and after the administration of intravenous contrast. Heavily T2-weighted images of the biliary and pancreatic ducts were obtained, and three-dimensional MRCP images were rendered by post processing. CONTRAST:  10mL GADAVIST GADOBUTROL 1 MMOL/ML IV SOLN COMPARISON:  Abdominal sonogram from 09/05/2023 FINDINGS: Lower chest: No acute abnormality. Hepatobiliary: There is diffuse hepatic steatosis. No enhancing liver lesions. Diffusely increased periportal T2 signal. Multiple stones identified within the gallbladder. The largest is in the fundus measuring 2.6 cm. No gallbladder wall thickening or pericholecystic inflammation. The common bile duct is nondilated. There is peripheral enhancement and increased T2 signal about the distal CBD, image 50/23 and image 47/25. Motion artifact and lack of significant common bile duct dilatation limits assessment for underlying choledocholithiasis. Tiny signal void artifact is noted on the axial T2 weighted images in the expected location of the common bile duct at the level of the head of pancreas, image 21/9 which is equivocal for common bile duct stone and cannot be confirmed on the coronal images or MRCP images. Pancreas: No mass, inflammatory changes, or other parenchymal abnormality identified. Spleen:  Within normal limits in size and appearance. Adrenals/Urinary Tract: Normal adrenal glands. Too small to characterize cyst within the right kidney measures 6 mm, image 14/3. No  hydronephrosis or suspicious mass identified bilaterally. Stomach/Bowel: Visualized portions within the abdomen are unremarkable. Vascular/Lymphatic: Normal caliber of the abdominal aorta. Upper abdominal vascularity is patent. Portacaval node measures 1.2 cm, image 35/25. Other:  No free fluid or fluid collections. Musculoskeletal: No suspicious bone lesions identified. IMPRESSION: 1. Cholelithiasis without secondary signs of acute cholecystitis. 2. There is peripheral enhancement and increased T2 signal about the distal CBD. Findings suggest focal inflammatory process. This may be the sequelae of occult distal common bile duct stone or recently passed stone. 3. Motion artifact and lack of significant common bile duct dilatation limits assessment for underlying choledocholithiasis. Tiny signal void artifact is noted on the axial T2 weighted images in the expected location of the common bile duct at the level of the head of pancreas which is equivocal for common bile duct stone and cannot be confirmed on the motion limited coronal images or MRCP images. 4. Diffuse hepatic steatosis. 5. Diffusely increased periportal T2 signal is nonspecific but can be seen in the setting of hepatitis. Electronically Signed   By: Kimberley Penman M.D.   On: 09/05/2023 05:57   MR 3D Recon At Scanner Result Date: 09/05/2023 CLINICAL DATA:  Gallstones with right upper quadrant pain EXAM: MRI ABDOMEN WITHOUT AND WITH CONTRAST (INCLUDING MRCP) TECHNIQUE: Multiplanar multisequence MR imaging of the abdomen was performed both before and after the administration of intravenous contrast. Heavily T2-weighted images of the biliary and pancreatic ducts were obtained, and three-dimensional MRCP images were rendered by post processing. CONTRAST:  10mL GADAVIST GADOBUTROL 1 MMOL/ML IV SOLN COMPARISON:  Abdominal sonogram from 09/05/2023 FINDINGS: Lower chest: No acute abnormality. Hepatobiliary: There is diffuse hepatic steatosis. No enhancing  liver lesions. Diffusely increased periportal T2 signal. Multiple stones identified within the gallbladder. The largest is in the fundus measuring 2.6 cm. No gallbladder wall thickening or pericholecystic inflammation. The common bile duct is nondilated. There is peripheral enhancement and increased T2 signal about the distal CBD, image 50/23 and image 47/25. Motion artifact and lack of significant common bile duct dilatation limits assessment for underlying choledocholithiasis. Tiny signal void artifact is noted on the axial T2 weighted images in the expected location of the  common bile duct at the level of the head of pancreas, image 21/9 which is equivocal for common bile duct stone and cannot be confirmed on the coronal images or MRCP images. Pancreas: No mass, inflammatory changes, or other parenchymal abnormality identified. Spleen:  Within normal limits in size and appearance. Adrenals/Urinary Tract: Normal adrenal glands. Too small to characterize cyst within the right kidney measures 6 mm, image 14/3. No hydronephrosis or suspicious mass identified bilaterally. Stomach/Bowel: Visualized portions within the abdomen are unremarkable. Vascular/Lymphatic: Normal caliber of the abdominal aorta. Upper abdominal vascularity is patent. Portacaval node measures 1.2 cm, image 35/25. Other:  No free fluid or fluid collections. Musculoskeletal: No suspicious bone lesions identified. IMPRESSION: 1. Cholelithiasis without secondary signs of acute cholecystitis. 2. There is peripheral enhancement and increased T2 signal about the distal CBD. Findings suggest focal inflammatory process. This may be the sequelae of occult distal common bile duct stone or recently passed stone. 3. Motion artifact and lack of significant common bile duct dilatation limits assessment for underlying choledocholithiasis. Tiny signal void artifact is noted on the axial T2 weighted images in the expected location of the common bile duct at the  level of the head of pancreas which is equivocal for common bile duct stone and cannot be confirmed on the motion limited coronal images or MRCP images. 4. Diffuse hepatic steatosis. 5. Diffusely increased periportal T2 signal is nonspecific but can be seen in the setting of hepatitis. Electronically Signed   By: Kimberley Penman M.D.   On: 09/05/2023 05:57   US  ABDOMEN LIMITED RUQ (LIVER/GB) Result Date: 09/05/2023 CLINICAL DATA:  Right upper quadrant pain EXAM: ULTRASOUND ABDOMEN LIMITED RIGHT UPPER QUADRANT COMPARISON:  None Available. FINDINGS: Gallbladder: Gallbladder is well distended with multiple gallstones. No wall thickening or pericholecystic fluid is noted. Negative sonographic Murphy's sign is elicited. Common bile duct: Diameter: 4 mm Liver: Diffusely increased in echogenicity consistent with fatty infiltration. Portal vein is patent on color Doppler imaging with normal direction of blood flow towards the liver. Other: None. IMPRESSION: Fatty liver. Cholelithiasis without complicating factors. Electronically Signed   By: Violeta Grey M.D.   On: 09/05/2023 02:13   DG Chest 2 View Result Date: 09/05/2023 CLINICAL DATA:  Chest pain EXAM: CHEST - 2 VIEW COMPARISON:  11/10/2019 FINDINGS: The heart size and mediastinal contours are within normal limits. Both lungs are clear. The visualized skeletal structures are unremarkable. IMPRESSION: No active cardiopulmonary disease. Electronically Signed   By: Janeece Mechanic M.D.   On: 09/05/2023 01:31   @IMAGES @  Assessment:  Abdominal pain - likely secondary to #2. Probable choledocholithiasis - somewhat equivocal MRCP but diagnosis likely given elevated liver enzymes. Elevated liver enzymes - Hyperbilirubinemia with increased alkaline phosphatase, hypertransaminasemia. Dysphagia  GERD, uncontrolled.    Recommendations:  Clear liquid diet. NPO p MN. EGD AND ERCP tomorrow. The patient understands the nature of the planned procedure, indications,  risks, alternatives and potential complications including but not limited to bleeding, infection, perforation, damage to internal organs and possible oversedation/side effects from anesthesia. He also acknowledges a 5% risk of pancreatitis related to ERCP. The patient agrees and gives consent to proceed. Dr. Ole Berkeley to perform procedures tomorrow. Please refer to procedure notes for findings, recommendations and patient disposition/instructions.  Thank you for the consult. Please call with questions or concerns.  Mardy Shall, "Sylvester Evert MD [facility] [address] [place], [place] [address] [phone]  09/05/2023 9:35 AM

## 2023-09-05 NOTE — ED Provider Notes (Signed)
 [facility] Provider Note    Event Date/Time   First MD Initiated Contact with Patient 09/05/23 0136     (approximate)   History   Chest Pain and Back Pain   HPI  Mark Hinton is a 43 y.o. male with history of gallstones, diabetes who presents emergency department upper abdominal pain, nausea.  No vomiting, bloody stools, melena, diarrhea.  Tried over-the-counter medications without relief.  States he has been told he has gastritis but states he has never had an EGD.  No prior abdominal surgery.  Pain radiates into the chest.  Pain is worse with deep inspiration and after eating.  No shortness of breath.  No fever, cough.  No history of PE, DVT, exogenous estrogen use, recent fractures, surgery, trauma, hospitalization, prolonged travel or other immobilization. No lower extremity swelling or pain. No calf tenderness.    History provided by patient, wife.    Past Medical History:  Diagnosis Date   Arthritis    Bone spur    NECK   Chronic back pain    Diabetes mellitus without complication (HCC)    Gastritis     Past Surgical History:  Procedure Laterality Date   ANTERIOR CERVICAL DECOMP/DISCECTOMY FUSION     C6 to C7    MEDICATIONS:  Prior to Admission medications   Medication Sig Start Date End Date Taking? Authorizing Provider  amoxicillin  (AMOXIL ) 500 MG capsule Take 1 capsule (500 mg total) by mouth 3 (three) times daily. 11/20/22   Sung, Jade J, MD  famotidine  (PEPCID ) 20 MG tablet Take 1 tablet (20 mg total) by mouth 2 (two) times daily. 07/26/19 08/25/19  Clevester Dally., MD  HYDROcodone -acetaminophen  (NORCO) 5-325 MG tablet Take 1 tablet by mouth every 6 (six) hours as needed for moderate pain. 11/20/22   Sung, Jade J, MD  phenazopyridine  (PYRIDIUM ) 100 MG tablet Take 1 tablet (100 mg total) by mouth 3 (three) times daily as needed for pain. 07/10/23 07/09/24  Awilda Lennox, PA-C  sertraline (ZOLOFT) 50 MG tablet Take 50 mg by mouth daily.     [provider]    Physical Exam   Triage Vital Signs: ED Triage Vitals [09/05/23 0054]  Encounter Vitals Group     BP 131/83     Systolic BP Percentile      Diastolic BP Percentile      Pulse Rate 82     Resp 18     Temp 97.7 F (36.5 C)     Temp Source Oral     SpO2 98 %     Weight 260 lb (117.9 kg)     Height 5\' 9"  (1.753 m)     Head Circumference      Peak Flow      Pain Score      Pain Loc      Pain Education      Exclude from Growth Chart     Most recent vital signs: Vitals:   09/05/23 0535 09/05/23 0600  BP: 122/77 108/71  Pulse: 78 75  Resp: 18   Temp: 98.4 F (36.9 C)   SpO2: 97% 95%    CONSTITUTIONAL: Alert, responds appropriately to questions. Well-appearing; well-nourished HEAD: Normocephalic, atraumatic EYES: Conjunctivae clear, pupils appear equal, sclera nonicteric ENT: normal nose; moist mucous membranes NECK: Supple, normal ROM CARD: RRR; S1 and S2 appreciated RESP: Normal chest excursion without splinting or tachypnea; breath sounds clear and equal bilaterally; no wheezes, no rhonchi, no rales, no hypoxia or  respiratory distress, speaking full sentences ABD/GI: Non-distended; soft, tender throughout the upper abdomen, no guarding or rebound, negative Murphy sign, no tenderness at McBurney's point BACK: The back appears normal EXT: Normal ROM in all joints; no deformity noted, no edema, no calf tenderness or calf swelling SKIN: Normal color for age and race; warm; no rash on exposed skin NEURO: Moves all extremities equally, normal speech PSYCH: The patient's mood and manner are appropriate.   ED Results / Procedures / Treatments   LABS: (all labs ordered are listed, but only abnormal results are displayed) Labs Reviewed  BASIC METABOLIC PANEL WITH GFR - Abnormal; Notable for the following components:      Result Value   Glucose, Bld 123 (*)    All other components within normal limits  HEPATIC FUNCTION PANEL - Abnormal; Notable  for the following components:   AST 140 (*)    ALT 277 (*)    Total Bilirubin 2.7 (*)    Bilirubin, Direct 1.1 (*)    Indirect Bilirubin 1.6 (*)    All other components within normal limits  URINALYSIS, ROUTINE W REFLEX MICROSCOPIC - Abnormal; Notable for the following components:   Color, Urine AMBER (*)    APPearance CLEAR (*)    Hgb urine dipstick SMALL (*)    All other components within normal limits  CBC  LIPASE, BLOOD  HEPATITIS PANEL, ACUTE  TROPONIN I (HIGH SENSITIVITY)  TROPONIN I (HIGH SENSITIVITY)     EKG:  EKG Interpretation Date/Time:  Sunday September 05 2023 00:47:02 EDT Ventricular Rate:  80 PR Interval:  150 QRS Duration:  90 QT Interval:  374 QTC Calculation: 431 R Axis:   22  Text Interpretation: Normal sinus rhythm Minimal voltage criteria for LVH, may be normal variant ( R in aVL ) Borderline ECG When compared with ECG of 10-Nov-2019 04:11, No significant change was found Confirmed by Verneda Golder [phone]) on 09/05/2023 1:37:55 AM         RADIOLOGY: My personal review and interpretation of imaging: Possible choledocholithiasis.  No cholecystitis.  I have personally reviewed all radiology reports.   MR ABDOMEN MRCP W WO CONTAST Result Date: 09/05/2023 CLINICAL DATA:  Gallstones with right upper quadrant pain EXAM: MRI ABDOMEN WITHOUT AND WITH CONTRAST (INCLUDING MRCP) TECHNIQUE: Multiplanar multisequence MR imaging of the abdomen was performed both before and after the administration of intravenous contrast. Heavily T2-weighted images of the biliary and pancreatic ducts were obtained, and three-dimensional MRCP images were rendered by post processing. CONTRAST:  10mL GADAVIST GADOBUTROL 1 MMOL/ML IV SOLN COMPARISON:  Abdominal sonogram from 09/05/2023 FINDINGS: Lower chest: No acute abnormality. Hepatobiliary: There is diffuse hepatic steatosis. No enhancing liver lesions. Diffusely increased periportal T2 signal. Multiple stones identified within the  gallbladder. The largest is in the fundus measuring 2.6 cm. No gallbladder wall thickening or pericholecystic inflammation. The common bile duct is nondilated. There is peripheral enhancement and increased T2 signal about the distal CBD, image 50/23 and image 47/25. Motion artifact and lack of significant common bile duct dilatation limits assessment for underlying choledocholithiasis. Tiny signal void artifact is noted on the axial T2 weighted images in the expected location of the common bile duct at the level of the head of pancreas, image 21/9 which is equivocal for common bile duct stone and cannot be confirmed on the coronal images or MRCP images. Pancreas: No mass, inflammatory changes, or other parenchymal abnormality identified. Spleen:  Within normal limits in size and appearance. Adrenals/Urinary Tract: Normal adrenal glands. Too  small to characterize cyst within the right kidney measures 6 mm, image 14/3. No hydronephrosis or suspicious mass identified bilaterally. Stomach/Bowel: Visualized portions within the abdomen are unremarkable. Vascular/Lymphatic: Normal caliber of the abdominal aorta. Upper abdominal vascularity is patent. Portacaval node measures 1.2 cm, image 35/25. Other:  No free fluid or fluid collections. Musculoskeletal: No suspicious bone lesions identified. IMPRESSION: 1. Cholelithiasis without secondary signs of acute cholecystitis. 2. There is peripheral enhancement and increased T2 signal about the distal CBD. Findings suggest focal inflammatory process. This may be the sequelae of occult distal common bile duct stone or recently passed stone. 3. Motion artifact and lack of significant common bile duct dilatation limits assessment for underlying choledocholithiasis. Tiny signal void artifact is noted on the axial T2 weighted images in the expected location of the common bile duct at the level of the head of pancreas which is equivocal for common bile duct stone and cannot be  confirmed on the motion limited coronal images or MRCP images. 4. Diffuse hepatic steatosis. 5. Diffusely increased periportal T2 signal is nonspecific but can be seen in the setting of hepatitis. Electronically Signed   By: Kimberley Penman M.D.   On: 09/05/2023 05:57   MR 3D Recon At Scanner Result Date: 09/05/2023 CLINICAL DATA:  Gallstones with right upper quadrant pain EXAM: MRI ABDOMEN WITHOUT AND WITH CONTRAST (INCLUDING MRCP) TECHNIQUE: Multiplanar multisequence MR imaging of the abdomen was performed both before and after the administration of intravenous contrast. Heavily T2-weighted images of the biliary and pancreatic ducts were obtained, and three-dimensional MRCP images were rendered by post processing. CONTRAST:  10mL GADAVIST GADOBUTROL 1 MMOL/ML IV SOLN COMPARISON:  Abdominal sonogram from 09/05/2023 FINDINGS: Lower chest: No acute abnormality. Hepatobiliary: There is diffuse hepatic steatosis. No enhancing liver lesions. Diffusely increased periportal T2 signal. Multiple stones identified within the gallbladder. The largest is in the fundus measuring 2.6 cm. No gallbladder wall thickening or pericholecystic inflammation. The common bile duct is nondilated. There is peripheral enhancement and increased T2 signal about the distal CBD, image 50/23 and image 47/25. Motion artifact and lack of significant common bile duct dilatation limits assessment for underlying choledocholithiasis. Tiny signal void artifact is noted on the axial T2 weighted images in the expected location of the common bile duct at the level of the head of pancreas, image 21/9 which is equivocal for common bile duct stone and cannot be confirmed on the coronal images or MRCP images. Pancreas: No mass, inflammatory changes, or other parenchymal abnormality identified. Spleen:  Within normal limits in size and appearance. Adrenals/Urinary Tract: Normal adrenal glands. Too small to characterize cyst within the right kidney measures 6  mm, image 14/3. No hydronephrosis or suspicious mass identified bilaterally. Stomach/Bowel: Visualized portions within the abdomen are unremarkable. Vascular/Lymphatic: Normal caliber of the abdominal aorta. Upper abdominal vascularity is patent. Portacaval node measures 1.2 cm, image 35/25. Other:  No free fluid or fluid collections. Musculoskeletal: No suspicious bone lesions identified. IMPRESSION: 1. Cholelithiasis without secondary signs of acute cholecystitis. 2. There is peripheral enhancement and increased T2 signal about the distal CBD. Findings suggest focal inflammatory process. This may be the sequelae of occult distal common bile duct stone or recently passed stone. 3. Motion artifact and lack of significant common bile duct dilatation limits assessment for underlying choledocholithiasis. Tiny signal void artifact is noted on the axial T2 weighted images in the expected location of the common bile duct at the level of the head of pancreas which is equivocal for common bile  duct stone and cannot be confirmed on the motion limited coronal images or MRCP images. 4. Diffuse hepatic steatosis. 5. Diffusely increased periportal T2 signal is nonspecific but can be seen in the setting of hepatitis. Electronically Signed   By: Kimberley Penman M.D.   On: 09/05/2023 05:57   US  ABDOMEN LIMITED RUQ (LIVER/GB) Result Date: 09/05/2023 CLINICAL DATA:  Right upper quadrant pain EXAM: ULTRASOUND ABDOMEN LIMITED RIGHT UPPER QUADRANT COMPARISON:  None Available. FINDINGS: Gallbladder: Gallbladder is well distended with multiple gallstones. No wall thickening or pericholecystic fluid is noted. Negative sonographic Murphy's sign is elicited. Common bile duct: Diameter: 4 mm Liver: Diffusely increased in echogenicity consistent with fatty infiltration. Portal vein is patent on color Doppler imaging with normal direction of blood flow towards the liver. Other: None. IMPRESSION: Fatty liver. Cholelithiasis without complicating  factors. Electronically Signed   By: Violeta Grey M.D.   On: 09/05/2023 02:13   DG Chest 2 View Result Date: 09/05/2023 CLINICAL DATA:  Chest pain EXAM: CHEST - 2 VIEW COMPARISON:  11/10/2019 FINDINGS: The heart size and mediastinal contours are within normal limits. Both lungs are clear. The visualized skeletal structures are unremarkable. IMPRESSION: No active cardiopulmonary disease. Electronically Signed   By: Janeece Mechanic M.D.   On: 09/05/2023 01:31     PROCEDURES:  Critical Care performed: No     .1-3 Lead EKG Interpretation  Performed by: Montell Leopard, Clover Dao, DO Authorized by: Anaalicia Reimann, Clover Dao, DO     Interpretation: normal     ECG rate:  82   ECG rate assessment: normal     Rhythm: sinus rhythm     Ectopy: none     Conduction: normal       IMPRESSION / MDM / ASSESSMENT AND PLAN / ED COURSE  I reviewed the triage vital signs and the nursing notes.    Patient here with upper abdominal pain.  History of gallstones and states he has been told he has gastritis but has never had an EGD.  The patient is on the cardiac monitor to evaluate for evidence of arrhythmia and/or significant heart rate changes.   DIFFERENTIAL DIAGNOSIS (includes but not limited to):   Cholelithiasis, cholecystitis, cholangitis, choledocholithiasis, pancreatitis, gastritis, GERD, peptic ulcer, H. pylori, duodenitis, doubt appendicitis.  Seems unlikely to be ACS.  PERC negative.   Patient's presentation is most consistent with acute presentation with potential threat to life or bodily function.   PLAN: Will obtain labs, EKG, right upper quadrant ultrasound.  Patient reports normally a GI cocktail helps his symptoms.  Will give Mylanta, lidocaine  and reassess.   MEDICATIONS GIVEN IN ED: Medications  nicotine (NICODERM CQ - dosed in mg/24 hours) patch 21 mg (21 mg Transdermal Patch Applied 09/05/23 0628)  alum & mag hydroxide-simeth (MAALOX/MYLANTA) 200-200-20 MG/5ML suspension 30 mL (30 mLs Oral Given  09/05/23 0154)  lidocaine  (XYLOCAINE ) 2 % viscous mouth solution 15 mL (15 mLs Mouth/Throat Given 09/05/23 0154)  pantoprazole  (PROTONIX ) EC tablet 40 mg (40 mg Oral Given 09/05/23 0154)  sodium chloride  0.9 % bolus 1,000 mL (0 mLs Intravenous Stopped 09/05/23 0352)  morphine (PF) 4 MG/ML injection 4 mg (4 mg Intravenous Given 09/05/23 0345)  ondansetron (ZOFRAN) injection 4 mg (4 mg Intravenous Given 09/05/23 0344)  gadobutrol (GADAVIST) 1 MMOL/ML injection 10 mL (10 mLs Intravenous Contrast Given 09/05/23 0524)  morphine (PF) 4 MG/ML injection 4 mg (4 mg Intravenous Given 09/05/23 0629)     ED COURSE: EKG nonischemic.  First troponin negative.  No leukocytosis.  LFTs elevated compared to prior with total bilirubin of 2.7.  Lipase normal.  Ultrasound reviewed and interpreted by myself and the radiologist and shows gallstones without cholecystitis, choledocholithiasis but given elevated LFTs with elevated total bilirubin and continued pain, have recommended MRCP to evaluate for choledocholithiasis.  Patient and wife comfortable with this plan.  Will give IV fluids, morphine, Zofran and keep him NPO.  Hepatitis panel is also pending.   Second troponin negative.  Urine shows no sign of infection.  MRCP reviewed and interpreted by myself and the radiologist and is equivocal for choledocholithiasis.  No obvious biliary ductal dilatation but he does have inflammation around the common bile duct which could represent choledocholithiasis versus a recently passed stone.  Patient reports pain is improved but did receive morphine here.  I discussed the case with Dr. Corky Diener on-call with gastroenterology.  He does agree that it would be reasonable to keep patient here in the hospital to monitor his LFTs, pain and then keep him n.p.o. at midnight for potential ERCP on Monday when Dr. Ole Berkeley is present.  Patient is afebrile with no leukocytosis, no vomiting.  Does not appear toxic, septic.  Low suspicion for cholangitis.  No signs of  cholecystitis on imaging.  Patient and wife are comfortable with this plan.  Will discuss with the hospitalist.   CONSULTS: Dr. Corky Diener with GI consulted.  Appreciate his help.  Consulted and discussed patient's case with hospitalist, Dr. Vallarie Gauze.  I have recommended admission and consulting physician agrees and will place admission orders.  Patient (and family if present) agree with this plan.   I reviewed all nursing notes, vitals, pertinent previous records.  All labs, EKGs, imaging ordered have been independently reviewed and interpreted by myself.    OUTSIDE RECORDS REVIEWED: Reviewed last urgent care note on 09/02/2022.       FINAL CLINICAL IMPRESSION(S) / ED DIAGNOSES   Final diagnoses:  Atypical chest pain  Elevated liver function tests  Cholelithiasis with choledocholithiasis     Rx / DC Orders   ED Discharge Orders     None        Note:  This document was prepared using Dragon voice recognition software and may include unintentional dictation errors.   Damani Rando, Clover Dao, DO 09/05/23 [phone]

## 2023-09-05 NOTE — Assessment & Plan Note (Signed)
 Found on Rt upper quadrant US . Equivocal on MRCP. Await ERCP by Dr. Ole Berkeley on Monday 09/06/2023.

## 2023-09-05 NOTE — H&P (Signed)
 History and Physical    Patient: Mark Hinton ZOX:[id number] DOB: 01-18-1981 DOA: 09/05/2023 DOS: the patient was seen and examined on 09/05/2023 PCP: [facility]  Patient coming from: Home  Chief Complaint:  Chief Complaint  Patient presents with   Chest Pain   Back Pain   HPI: Mark Hinton is a 43 y.o. male with medical history significant of DMII, Gallstones, and gastritis presents to [facility] ED on 09/05/2023 with complaints of epigastric abdominal pain. The patient states that some time ago he presented with a much milder form of this pain that was diagnosed as gastritis. However this week he has had episodes of severe epigastric pain, like he has been "punched in the gut". He has had nausea, but no vomiting.   In the ED the patient was found to have cholelithiasis and fatty liver on right upper quadrant ultrasound. MRCP was equivocal for choledocolithiasis.His LFT's are increased with  AAST 140 and ALT of 277. T bili was 2.7 (indirect 1.6).  The patient denies fevers, chills, cough, shortness of breath, chest pain, diarrhea, constipation, vomiting, hematemesis, melena, hematochezia. No neurological changes or mental status changes. No lesions, sores, or rashes.   The patient will be admitted to a medical bed. Gastroenterology (Dr. Corky Diener) has been consulted from the ED. Dr. Ole Berkeley will be available Monday to performe ERCP to more fully evaluate the patient for choledocolithiasis.  Review of Systems: As mentioned in the history of present illness. All other systems reviewed and are negative. Past Medical History:  Diagnosis Date   Arthritis    Bone spur    NECK   Chronic back pain    Diabetes mellitus without complication (HCC)    Gastritis    Past Surgical History:  Procedure Laterality Date   ANTERIOR CERVICAL DECOMP/DISCECTOMY FUSION     C6 to C7   Social History:  reports that he has been smoking cigarettes. He has never used smokeless tobacco. He reports that he does  not currently use drugs. He reports that he does not drink alcohol.  No Known Allergies  History reviewed. No pertinent family history.  Prior to Admission medications   Medication Sig Start Date End Date Taking? Authorizing Provider  metFORMIN (GLUCOPHAGE) 500 MG tablet Take 500 mg by mouth 2 (two) times daily with a meal.   Yes [provider]  amoxicillin  (AMOXIL ) 500 MG capsule Take 1 capsule (500 mg total) by mouth 3 (three) times daily. Patient not taking: Reported on 09/05/2023 11/20/22   Sung, Jade J, MD  famotidine  (PEPCID ) 20 MG tablet Take 1 tablet (20 mg total) by mouth 2 (two) times daily. 07/26/19 08/25/19  Clevester Dally., MD  HYDROcodone -acetaminophen  (NORCO) 5-325 MG tablet Take 1 tablet by mouth every 6 (six) hours as needed for moderate pain. Patient not taking: Reported on 09/05/2023 11/20/22   Sung, Jade J, MD  phenazopyridine  (PYRIDIUM ) 100 MG tablet Take 1 tablet (100 mg total) by mouth 3 (three) times daily as needed for pain. Patient not taking: Reported on 09/05/2023 07/10/23 07/09/24  Evans, Alexandra, PA-C  sertraline (ZOLOFT) 50 MG tablet Take 50 mg by mouth daily. Patient not taking: Reported on 09/05/2023    [provider]    Physical Exam: Vitals:   09/05/23 0630 09/05/23 0700 09/05/23 0730 09/05/23 0841  BP: 117/88 114/83 106/77 112/84  Pulse: 71 82 72 73  Resp:    18  Temp:    98.1 F (36.7 C)  TempSrc:  SpO2: 95% 95% 96% 96%  Weight:      Height:       Exam:  Constitutional:  The patient is awake, alert, and oriented x 3. No acute distress. Eyes:  pupils and irises appear normal Normal lids and conjunctivae ENMT:  grossly normal hearing  Lips appear normal external ears, nose appear normal Oropharynx: mucosa, tongue,posterior pharynx appear normal Neck:  neck appears normal, no masses, normal ROM, supple no thyromegaly Respiratory:  No increased work of breathing. No wheezes, rales, or rhonchi No tactile  fremitus Cardiovascular:  Regular rate and rhythm No murmurs, ectopy, or gallups. No lateral PMI. No thrills. Abdomen:  Abdomen is soft, non-distended Positive for epigastric abdominal pain. No hernias, masses, or organomegaly Normoactive bowel sounds.  Musculoskeletal:  No cyanosis, clubbing, or edema Skin:  No rashes, lesions, ulcers palpation of skin: no induration or nodules Neurologic:  CN 2-12 intact Sensation all 4 extremities intact Psychiatric:  Mental status Mood, affect appropriate Orientation to person, place, time  judgment and insight appear intact  Data Reviewed:  CBC, CMP, Right upper quadrant US , MRCP  Assessment and Plan: Abdominal pain The patient is complaining of epigastric pain that is worse after eating. He cannot specify that his pain is worse after a fatty meal or not. He denies vomiting, although he states he has been nauseated. No fevers or chills. DDx include chole docolithiasis, acalculous cholecystitis, pancreatitis, gastritis, gastric ulcer, and duodenal ulcer. The patient has been treated for gastritis in the past, although he has never had an EGD. He states that this pain is much worse and more acute than the pain for which he was given this diagnosis.   Choledocholithiasis Right upper quadrant US  demonstrated cholelithiasis, but MRCP was equivocal on Choledocolithiasis.  Gastroenterology has been consulted. It is suggested that Dr. Ole Berkeley, who would be available on Monday could do an ERCP to further investigate the diagnosis and the source of the patient's pain.  Cholelithiasis Found on Rt upper quadrant US . Equivocal on MRCP. Await ERCP by Dr. Ole Berkeley on Monday 09/06/2023.  DM II (diabetes mellitus, type II), controlled (HCC) The patient takes metformin 500 mg bid for his diabetes. This will be held as he may require dye for procedures to evaluate his abdominal pain. His glucoses will be followed with FSBS and SSI.     Advance Care Planning:   Code  Status: Full Code   Consults: Gastroenterology  Family Communication: wife is at bedside. All questions answered to the best of my ability.  Severity of Illness: The appropriate patient status for this patient is OBSERVATION. Observation status is judged to be reasonable and necessary in order to provide the required intensity of service to ensure the patient's safety. The patient's presenting symptoms, physical exam findings, and initial radiographic and laboratory data in the context of their medical condition is felt to place them at decreased risk for further clinical deterioration. Furthermore, it is anticipated that the patient will be medically stable for discharge from the hospital within 2 midnights of admission.   Author: Nickalos Petersen, DO 09/05/2023 8:57 AM  For on call review [email or web address].

## 2023-09-05 NOTE — ED Notes (Signed)
 Advised nurse that patient has ready bed

## 2023-09-06 ENCOUNTER — Encounter: Payer: Self-pay | Admitting: Internal Medicine

## 2023-09-06 ENCOUNTER — Encounter
Admission: EM | Disposition: A | Discharge status: Home / Self Care | Payer: Self-pay | Source: Home / Self Care | Attending: Internal Medicine

## 2023-09-06 ENCOUNTER — Observation Stay: Payer: MEDICAID

## 2023-09-06 ENCOUNTER — Observation Stay: Payer: MEDICAID | Admitting: General Practice

## 2023-09-06 DIAGNOSIS — K8050 Calculus of bile duct without cholangitis or cholecystitis without obstruction: Principal | ICD-10-CM

## 2023-09-06 DIAGNOSIS — K449 Diaphragmatic hernia without obstruction or gangrene: Secondary | ICD-10-CM | POA: Diagnosis not present

## 2023-09-06 DIAGNOSIS — K222 Esophageal obstruction: Secondary | ICD-10-CM

## 2023-09-06 HISTORY — PX: ESOPHAGEAL DILATION: SHX303

## 2023-09-06 HISTORY — PX: ERCP: SHX5425

## 2023-09-06 HISTORY — PX: ESOPHAGOGASTRODUODENOSCOPY: SHX5428

## 2023-09-06 LAB — GLUCOSE, CAPILLARY
Glucose-Capillary: 137 mg/dL — ABNORMAL HIGH (ref 70–99)
Glucose-Capillary: 88 mg/dL (ref 70–99)
Glucose-Capillary: 113 mg/dL — ABNORMAL HIGH (ref 70–99)

## 2023-09-06 LAB — HEMOGLOBIN A1C
Hgb A1c MFr Bld: 6.8 % — ABNORMAL HIGH (ref 4.8–5.6)
Mean Plasma Glucose: 148 mg/dL

## 2023-09-06 LAB — HIV ANTIBODY (ROUTINE TESTING W REFLEX): HIV Screen 4th Generation wRfx: NONREACTIVE

## 2023-09-06 SURGERY — ERCP, WITH INTERVENTION IF INDICATED
Anesthesia: General

## 2023-09-06 MED ORDER — SODIUM CHLORIDE 0.9 % IV SOLN: INTRAVENOUS | Status: DC

## 2023-09-06 MED ORDER — PROPOFOL 500 MG/50ML IV EMUL
INTRAVENOUS | Status: DC | PRN
  Administered 2023-09-06: 50 mg via INTRAVENOUS
  Administered 2023-09-06: 140 ug/kg/min via INTRAVENOUS

## 2023-09-06 MED ORDER — DICLOFENAC SUPPOSITORY 100 MG
RECTAL | Status: AC
  Filled 2023-09-06: qty 1

## 2023-09-06 MED ORDER — FENTANYL CITRATE (PF) 100 MCG/2ML IJ SOLN
INTRAMUSCULAR | Status: AC
  Filled 2023-09-06: qty 2

## 2023-09-06 MED ORDER — DICLOFENAC SUPPOSITORY 100 MG
100.0000 mg | Freq: Once | RECTAL | Status: AC
  Administered 2023-09-06: 100 mg via RECTAL

## 2023-09-06 MED ORDER — DEXMEDETOMIDINE HCL IN NACL 80 MCG/20ML IV SOLN
INTRAVENOUS | Status: AC
  Filled 2023-09-06: qty 20

## 2023-09-06 MED ORDER — FENTANYL CITRATE (PF) 100 MCG/2ML IJ SOLN
INTRAMUSCULAR | Status: DC | PRN
  Administered 2023-09-06 (×2): 50 ug via INTRAVENOUS

## 2023-09-06 MED ORDER — DEXMEDETOMIDINE HCL IN NACL 200 MCG/50ML IV SOLN
INTRAVENOUS | Status: DC | PRN
  Administered 2023-09-06: 4 ug via INTRAVENOUS

## 2023-09-06 MED ORDER — NICOTINE 21 MG/24HR TD PT24: 21.0000 mg | MEDICATED_PATCH | Freq: Every day | TRANSDERMAL | Status: DC

## 2023-09-06 MED ORDER — LIDOCAINE HCL (CARDIAC) PF 100 MG/5ML IV SOSY
PREFILLED_SYRINGE | INTRAVENOUS | Status: DC | PRN
  Administered 2023-09-06: 100 mg via INTRAVENOUS

## 2023-09-06 MED ORDER — LACTATED RINGERS IV SOLN: INTRAVENOUS | Status: DC

## 2023-09-06 MED ORDER — ONDANSETRON HCL 4 MG/2ML IJ SOLN
INTRAMUSCULAR | Status: DC | PRN
  Administered 2023-09-06: 4 mg via INTRAVENOUS

## 2023-09-06 NOTE — Anesthesia Procedure Notes (Signed)
 Procedure Name: Intubation Date/Time: 09/06/2023 12:43 PM  Performed by: Wilkins Hardy I, CRNAPre-anesthesia Checklist: Patient identified, Emergency Drugs available, Suction available and Patient being monitored Patient Re-evaluated:Patient Re-evaluated prior to induction Oxygen Delivery Method: Circle system utilized Preoxygenation: Pre-oxygenation with 100% oxygen Induction Type: IV induction Ventilation: Mask ventilation without difficulty Laryngoscope Size: McGrath and 4 Grade View: Grade I Tube type: Oral Tube size: 7.5 mm Number of attempts: 1 Airway Equipment and Method: Stylet and Oral airway Placement Confirmation: ETT inserted through vocal cords under direct vision, positive ETCO2 and breath sounds checked- equal and bilateral Tube secured with: Tape Dental Injury: Teeth and Oropharynx as per pre-operative assessment

## 2023-09-06 NOTE — Transfer of Care (Signed)
 Immediate Anesthesia Transfer of Care Note  Patient: Mark Hinton  Procedure(s) Performed: ERCP, WITH INTERVENTION IF INDICATED EGD (ESOPHAGOGASTRODUODENOSCOPY) DILATION, ESOPHAGUS  Patient Location: PACU  Anesthesia Type:General  Level of Consciousness: awake, alert , and oriented  Airway & Oxygen Therapy: Patient Spontanous Breathing  Post-op Assessment: Report given to RN and Post -op Vital signs reviewed and stable  Post vital signs: stable  Last Vitals:  Vitals Value Taken Time  BP 136/71 09/06/23 1316  Temp 36.1 C 09/06/23 1316  Pulse 89 09/06/23 1320  Resp 13 09/06/23 1320  SpO2 97 % 09/06/23 1320  Vitals shown include unfiled device data.  Last Pain:  Vitals:   09/06/23 1316  TempSrc: Temporal  PainSc: 0-No pain      Patients Stated Pain Goal: 0 (09/05/23 1632)  Complications: No notable events documented.

## 2023-09-06 NOTE — Discharge Summary (Addendum)
 Physician Discharge Summary   Patient: Mark Hinton MRN: [id number] DOB: [birth year]  Admit date:     09/05/2023  Discharge date: 09/06/23  Discharge Physician: Ezzard Holms   PCP: [facility]   Recommendations at discharge:  Follow-up with general surgery  Discharge Diagnoses: Abdominal pain Choledocholithiasis Cholelithiasis DM II (diabetes mellitus, type II), controlled [facility])  Hospital Course:  Xsavier Seeley is a 43 y.o. male with medical history significant of DMII, Gallstones, and gastritis presents to [facility] ED on 09/05/2023 with complaints of epigastric abdominal pain. The patient states that some time ago he presented with a much milder form of this pain that was diagnosed as gastritis. However this week he has had episodes of severe epigastric pain, like he has been "punched in the gut". He has had nausea, but no vomiting.    In the ED the patient was found to have cholelithiasis and fatty liver on right upper quadrant ultrasound. MRCP was equivocal for choledocolithiasis.His LFT's are increased with  AAST 140 and ALT of 277. T bili was 2.7 (indirect 1.6). Gastroenterology (Dr. Corky Diener) has been consulted from the ED. Dr. Ole Berkeley took the patient for ERCP today with removal of calculi.  Patient was also seen by general surgery and recommended for cholecystectomy however patient opted to follow-up as an outpatient to have this done at a later time as he wants to go home today.  Patient cleared by GI for discharge today to monitor for any signs of pancreatitis and to return to the ED if any.  Consultants: GI, general surgery Procedures performed: As mentioned above Disposition: Home Diet recommendation:  Cardiac diet DISCHARGE MEDICATION: Allergies as of 09/06/2023   No Known Allergies      Medication List     STOP taking these medications    amoxicillin  500 MG capsule Commonly known as: AMOXIL    HYDROcodone -acetaminophen  5-325 MG tablet Commonly known as: Norco    sertraline 50 MG tablet Commonly known as: ZOLOFT       TAKE these medications    acetaminophen  500 MG tablet Commonly known as: TYLENOL  Take 500 mg by mouth every 6 (six) hours as needed for mild pain (pain score 1-3), fever or headache.   calcium carbonate 500 MG chewable tablet Commonly known as: TUMS - dosed in mg elemental calcium Chew 1 tablet by mouth daily.   famotidine  20 MG tablet Commonly known as: PEPCID  Take 1 tablet (20 mg total) by mouth 2 (two) times daily.   metFORMIN 500 MG tablet Commonly known as: GLUCOPHAGE Take 500 mg by mouth 2 (two) times daily with a meal.   phenazopyridine  100 MG tablet Commonly known as: PYRIDIUM  Take 1 tablet (100 mg total) by mouth 3 (three) times daily as needed for pain.        Follow-up Information     Eldred Grego, MD. Call.   Specialty: General Surgery Contact information: [address] [place] [address] [phone]                Discharge Exam: Cleavon Curls Weights   09/05/23 0054  Weight: 117.9 kg  General: Not in acute distress Oropharynx: mucosa, tongue,posterior pharynx appear normal Neck:  neck appears normal, no masses, normal ROM, supple no thyromegaly Respiratory:  No increased work of breathing. No wheezes, rales, or rhonchi No tactile fremitus Cardiovascular:  Regular rate and rhythm No murmurs, ectopy, or gallups. No lateral PMI. No thrills. Abdomen:  Abdomen is soft, non-distended Nontender No hernias, masses, or organomegaly Normoactive  bowel sounds.  Musculoskeletal:  No cyanosis, clubbing, or edema Skin:  No rashes, lesions, ulcers palpation of skin: no induration or nodules Neurologic:  CN 2-12 intact Sensation all 4 extremities intact Psychiatric:  Mental status Mood, affect appropriate  Condition at discharge: good  The results of significant diagnostics from this hospitalization (including imaging, microbiology, ancillary and laboratory) are  listed below for reference.   Imaging Studies: DG C-Arm 1-60 Min-No Report Result Date: 09/06/2023 Fluoroscopy was utilized by the requesting physician.  No radiographic interpretation.   MR ABDOMEN MRCP W WO CONTAST Result Date: 09/05/2023 CLINICAL DATA:  Gallstones with right upper quadrant pain EXAM: MRI ABDOMEN WITHOUT AND WITH CONTRAST (INCLUDING MRCP) TECHNIQUE: Multiplanar multisequence MR imaging of the abdomen was performed both before and after the administration of intravenous contrast. Heavily T2-weighted images of the biliary and pancreatic ducts were obtained, and three-dimensional MRCP images were rendered by post processing. CONTRAST:  10mL GADAVIST GADOBUTROL 1 MMOL/ML IV SOLN COMPARISON:  Abdominal sonogram from 09/05/2023 FINDINGS: Lower chest: No acute abnormality. Hepatobiliary: There is diffuse hepatic steatosis. No enhancing liver lesions. Diffusely increased periportal T2 signal. Multiple stones identified within the gallbladder. The largest is in the fundus measuring 2.6 cm. No gallbladder wall thickening or pericholecystic inflammation. The common bile duct is nondilated. There is peripheral enhancement and increased T2 signal about the distal CBD, image 50/23 and image 47/25. Motion artifact and lack of significant common bile duct dilatation limits assessment for underlying choledocholithiasis. Tiny signal void artifact is noted on the axial T2 weighted images in the expected location of the common bile duct at the level of the head of pancreas, image 21/9 which is equivocal for common bile duct stone and cannot be confirmed on the coronal images or MRCP images. Pancreas: No mass, inflammatory changes, or other parenchymal abnormality identified. Spleen:  Within normal limits in size and appearance. Adrenals/Urinary Tract: Normal adrenal glands. Too small to characterize cyst within the right kidney measures 6 mm, image 14/3. No hydronephrosis or suspicious mass identified  bilaterally. Stomach/Bowel: Visualized portions within the abdomen are unremarkable. Vascular/Lymphatic: Normal caliber of the abdominal aorta. Upper abdominal vascularity is patent. Portacaval node measures 1.2 cm, image 35/25. Other:  No free fluid or fluid collections. Musculoskeletal: No suspicious bone lesions identified. IMPRESSION: 1. Cholelithiasis without secondary signs of acute cholecystitis. 2. There is peripheral enhancement and increased T2 signal about the distal CBD. Findings suggest focal inflammatory process. This may be the sequelae of occult distal common bile duct stone or recently passed stone. 3. Motion artifact and lack of significant common bile duct dilatation limits assessment for underlying choledocholithiasis. Tiny signal void artifact is noted on the axial T2 weighted images in the expected location of the common bile duct at the level of the head of pancreas which is equivocal for common bile duct stone and cannot be confirmed on the motion limited coronal images or MRCP images. 4. Diffuse hepatic steatosis. 5. Diffusely increased periportal T2 signal is nonspecific but can be seen in the setting of hepatitis. Electronically Signed   By: Kimberley Penman M.D.   On: 09/05/2023 05:57   MR 3D Recon At Scanner Result Date: 09/05/2023 CLINICAL DATA:  Gallstones with right upper quadrant pain EXAM: MRI ABDOMEN WITHOUT AND WITH CONTRAST (INCLUDING MRCP) TECHNIQUE: Multiplanar multisequence MR imaging of the abdomen was performed both before and after the administration of intravenous contrast. Heavily T2-weighted images of the biliary and pancreatic ducts were obtained, and three-dimensional MRCP images were rendered by post  processing. CONTRAST:  10mL GADAVIST GADOBUTROL 1 MMOL/ML IV SOLN COMPARISON:  Abdominal sonogram from 09/05/2023 FINDINGS: Lower chest: No acute abnormality. Hepatobiliary: There is diffuse hepatic steatosis. No enhancing liver lesions. Diffusely increased periportal T2  signal. Multiple stones identified within the gallbladder. The largest is in the fundus measuring 2.6 cm. No gallbladder wall thickening or pericholecystic inflammation. The common bile duct is nondilated. There is peripheral enhancement and increased T2 signal about the distal CBD, image 50/23 and image 47/25. Motion artifact and lack of significant common bile duct dilatation limits assessment for underlying choledocholithiasis. Tiny signal void artifact is noted on the axial T2 weighted images in the expected location of the common bile duct at the level of the head of pancreas, image 21/9 which is equivocal for common bile duct stone and cannot be confirmed on the coronal images or MRCP images. Pancreas: No mass, inflammatory changes, or other parenchymal abnormality identified. Spleen:  Within normal limits in size and appearance. Adrenals/Urinary Tract: Normal adrenal glands. Too small to characterize cyst within the right kidney measures 6 mm, image 14/3. No hydronephrosis or suspicious mass identified bilaterally. Stomach/Bowel: Visualized portions within the abdomen are unremarkable. Vascular/Lymphatic: Normal caliber of the abdominal aorta. Upper abdominal vascularity is patent. Portacaval node measures 1.2 cm, image 35/25. Other:  No free fluid or fluid collections. Musculoskeletal: No suspicious bone lesions identified. IMPRESSION: 1. Cholelithiasis without secondary signs of acute cholecystitis. 2. There is peripheral enhancement and increased T2 signal about the distal CBD. Findings suggest focal inflammatory process. This may be the sequelae of occult distal common bile duct stone or recently passed stone. 3. Motion artifact and lack of significant common bile duct dilatation limits assessment for underlying choledocholithiasis. Tiny signal void artifact is noted on the axial T2 weighted images in the expected location of the common bile duct at the level of the head of pancreas which is equivocal for  common bile duct stone and cannot be confirmed on the motion limited coronal images or MRCP images. 4. Diffuse hepatic steatosis. 5. Diffusely increased periportal T2 signal is nonspecific but can be seen in the setting of hepatitis. Electronically Signed   By: Kimberley Penman M.D.   On: 09/05/2023 05:57   US  ABDOMEN LIMITED RUQ (LIVER/GB) Result Date: 09/05/2023 CLINICAL DATA:  Right upper quadrant pain EXAM: ULTRASOUND ABDOMEN LIMITED RIGHT UPPER QUADRANT COMPARISON:  None Available. FINDINGS: Gallbladder: Gallbladder is well distended with multiple gallstones. No wall thickening or pericholecystic fluid is noted. Negative sonographic Murphy's sign is elicited. Common bile duct: Diameter: 4 mm Liver: Diffusely increased in echogenicity consistent with fatty infiltration. Portal vein is patent on color Doppler imaging with normal direction of blood flow towards the liver. Other: None. IMPRESSION: Fatty liver. Cholelithiasis without complicating factors. Electronically Signed   By: Violeta Grey M.D.   On: 09/05/2023 02:13   DG Chest 2 View Result Date: 09/05/2023 CLINICAL DATA:  Chest pain EXAM: CHEST - 2 VIEW COMPARISON:  11/10/2019 FINDINGS: The heart size and mediastinal contours are within normal limits. Both lungs are clear. The visualized skeletal structures are unremarkable. IMPRESSION: No active cardiopulmonary disease. Electronically Signed   By: Janeece Mechanic M.D.   On: 09/05/2023 01:31    Microbiology: Results for orders placed or performed during the hospital encounter of 07/10/23  Urine Culture     Status: None   Collection Time: 07/10/23  4:15 PM   Specimen: Urine, Clean Catch  Result Value Ref Range Status   Specimen Description   Final  URINE, CLEAN CATCH Performed at [facility], [address] [place], [place] [address]    Special Requests   Final    NONE Performed at [facility], [address] [place], [place] [address]    Culture   Final    NO  GROWTH Performed at [facility], [address], [place], [place] [address]    Report Status 07/12/2023 FINAL  Final  Chlamydia/NGC rt PCR ([facility] only)     Status: None   Collection Time: 07/10/23  4:15 PM   Specimen: Urine  Result Value Ref Range Status   Specimen source GC/Chlam URINE, RANDOM  Final   Chlamydia Tr NOT DETECTED NOT DETECTED Final   N gonorrhoeae NOT DETECTED NOT DETECTED Final    Comment: (NOTE) This CT/NG assay has not been evaluated in patients with a history of  hysterectomy. Performed at [facility], [address] [place], [place] [address]     Labs: CBC: Recent Labs  Lab 09/05/23 0056  WBC 9.2  HGB 14.5  HCT 43.8  MCV 92.8  PLT 185   Basic Metabolic Panel: Recent Labs  Lab 09/05/23 0056  NA 135  K 3.8  CL 98  CO2 28  GLUCOSE 123*  BUN 19  CREATININE 1.23  CALCIUM 9.6   Liver Function Tests: Recent Labs  Lab 09/05/23 0056  AST 140*  ALT 277*  ALKPHOS 66  BILITOT 2.7*  PROT 7.3  ALBUMIN 4.1   CBG: Recent Labs  Lab 09/05/23 1143 09/05/23 1630 09/05/23 2158 09/06/23 0823 09/06/23 1651  GLUCAP 108* 119* 101* 88 113*    Discharge time spent:  .  Signed: Ezzard Holms, MD [facility] 09/06/2023

## 2023-09-06 NOTE — Anesthesia Preprocedure Evaluation (Signed)
 Anesthesia Evaluation  Patient identified by MRN, date of birth, ID band Patient awake    Reviewed: Allergy & Precautions, NPO status , Patient's Chart, lab work & pertinent test results  Airway Mallampati: III  TM Distance: >3 FB Neck ROM: full    Dental  (+) Chipped   Pulmonary neg pulmonary ROS, Current Smoker and Patient abstained from smoking.   Pulmonary exam normal        Cardiovascular negative cardio ROS Normal cardiovascular exam     Neuro/Psych negative neurological ROS  negative psych ROS   GI/Hepatic Neg liver ROS,GERD  Medicated and Controlled,,  Endo/Other  negative endocrine ROSdiabetes    Renal/GU negative Renal ROS  negative genitourinary   Musculoskeletal   Abdominal   Peds  Hematology negative hematology ROS (+)   Anesthesia Other Findings Past Medical History: No date: Arthritis No date: Bone spur     Comment:  NECK No date: Chronic back pain No date: Diabetes mellitus without complication (HCC) No date: Gastritis  Past Surgical History: No date: ANTERIOR CERVICAL DECOMP/DISCECTOMY FUSION     Comment:  C6 to C7  BMI    Body Mass Index: 38.40 kg/m      Reproductive/Obstetrics negative OB ROS                             Anesthesia Physical Anesthesia Plan  ASA: 2  Anesthesia Plan: General   Post-op Pain Management: Minimal or no pain anticipated   Induction: Intravenous  PONV Risk Score and Plan: 3 and Propofol infusion, TIVA and Ondansetron  Airway Management Planned: Nasal Cannula  Additional Equipment: None  Intra-op Plan:   Post-operative Plan:   Informed Consent: I have reviewed the patients History and Physical, chart, labs and discussed the procedure including the risks, benefits and alternatives for the proposed anesthesia with the patient or authorized representative who has indicated his/her understanding and acceptance.     Dental  advisory given  Plan Discussed with: CRNA and Surgeon  Anesthesia Plan Comments: (Discussed risks of anesthesia with patient, including possibility of difficulty with spontaneous ventilation under anesthesia necessitating airway intervention, PONV, and rare risks such as cardiac or respiratory or neurological events, and allergic reactions. Discussed the role of CRNA in patient's perioperative care. Patient understands.)       Anesthesia Quick Evaluation

## 2023-09-06 NOTE — Op Note (Signed)
 [facility] Gastroenterology Patient Name: Mark Hinton Procedure Date: 09/06/2023 11:52 AM MRN: [id number] Account #: [id number] Date of Birth: 07/19/1980 Admit Type: Inpatient Age: 43 Room: [facility] ENDO ROOM 4 Gender: Male Note Status: Finalized Instrument Name: Upper Endoscope [id number] Procedure:             ERCP Indications:           Common bile duct stone(s) Providers:             Marnee Sink MD, MD Referring MD:          Clinic [facility], MD (Referring MD) Medicines:             General Anesthesia Complications:         No immediate complications. Procedure:             Pre-Anesthesia Assessment:                        - Prior to the procedure, a History and Physical was                         performed, and patient medications and allergies were                         reviewed. The patient's tolerance of previous                         anesthesia was also reviewed. The risks and benefits                         of the procedure and the sedation options and risks                         were discussed with the patient. All questions were                         answered, and informed consent was obtained. Prior                         Anticoagulants: The patient has taken no anticoagulant                         or antiplatelet agents. ASA Grade Assessment: II - A                         patient with mild systemic disease. After reviewing                         the risks and benefits, the patient was deemed in                         satisfactory condition to undergo the procedure.                        After obtaining informed consent, the scope was passed                         under direct vision. Throughout the procedure, the  patient's blood pressure, pulse, and oxygen                         saturations were monitored continuously. The Endoscope                         was introduced through the mouth, and used  to inject                         contrast into and used to inject contrast into the                         bile duct. The ERCP was accomplished without                         difficulty. The patient tolerated the procedure well. Findings:      A standard esophagogastroduodenoscopy scope was used for the examination       of the upper gastrointestinal tract. The scope was passed under direct       vision through the upper GI tract. A small hiatal hernia was present.       The Z-line was irregular and was found at the gastroesophageal junction.       One benign-appearing, intrinsic mild stenosis was found at the       gastroesophageal junction. The stenosis was traversed. The examined       duodenum was normal. A TTS dilator was passed through the scope.       Dilation with a 15-16.5-18 mm balloon dilator was performed to 18 mm at       the gastroesophageal junction. The scout film was normal. The esophagus       was successfully intubated under direct vision. The scope was advanced       to a normal major papilla in the descending duodenum without detailed       examination of the pharynx, larynx and associated structures, and upper       GI tract. The upper GI tract was grossly normal. The bile duct was       deeply cannulated with the short-nosed traction sphincterotome. Contrast       was injected. I personally interpreted the bile duct images. There was       brisk flow of contrast through the ducts. Image quality was excellent.       Contrast extended to the entire biliary tree. The main bile duct       contained filling defect(s) thought to be a stone and sludge. A wire was       passed into the biliary tree. A 7 mm biliary sphincterotomy was made       with a traction (standard) sphincterotome using ERBE electrocautery.       There was no post-sphincterotomy bleeding. The biliary tree was swept       with a 15 mm balloon starting at the bifurcation. Sludge was swept from       the  duct. Many stones were removed. No stones remained. Impression:            - Small hiatal hernia.                        - Z-line irregular, at the gastroesophageal junction.                        -  Benign-appearing esophageal stenosis.                        - Normal examined duodenum.                        - Dilation performed at the gastroesophageal junction.                        - A filling defect consistent with a stone and sludge                         was seen on the cholangiogram.                        - Choledocholithiasis was found. Complete removal was                         accomplished by biliary sphincterotomy and balloon                         extraction.                        - A biliary sphincterotomy was performed.                        - The biliary tree was swept. Recommendation:        - Return patient to hospital ward for ongoing care.                        - Clear liquid diet today.                        - Continue present medications.                        - Watch for pancreatitis, bleeding, perforation, and                         cholangitis.                        - Surgery consult for chole due to CBD stones Procedure Code(s):     --- Professional ---                        [phone], Endoscopic retrograde cholangiopancreatography                         (ERCP); with removal of calculi/debris from                         biliary/pancreatic duct(s)                        43262, Endoscopic retrograde cholangiopancreatography                         (ERCP); with sphincterotomy/papillotomy                        43249, Esophagogastroduodenoscopy, flexible,  transoral; with transendoscopic balloon dilation of                         esophagus (less than 30 mm diameter)                        74328, Endoscopic catheterization of the biliary                         ductal system, radiological supervision and                          interpretation Diagnosis Code(s):     --- Professional ---                        K80.50, Calculus of bile duct without cholangitis or                         cholecystitis without obstruction                        K22.2, Esophageal obstruction                        R93.2, Abnormal findings on diagnostic imaging of                         liver and biliary tract CPT copyright 2022 [employer]. All rights reserved. The codes documented in this report are preliminary and upon coder review may  be revised to meet current compliance requirements. Marnee Sink MD, MD 09/06/2023 1:09:54 PM This report has been signed electronically. Number of Addenda: 0 Note Initiated On: 09/06/2023 11:52 AM Estimated Blood Loss:  Estimated blood loss: none.      [facility]

## 2023-09-06 NOTE — Consult Note (Signed)
 [facility]-General Surgery  SURGICAL CONSULTATION NOTE    HISTORY OF PRESENT ILLNESS (HPI):  43 y.o. male presented to [facility] ED yesterday 09/05/23 for evaluation of epigastric abdominal pain and nausea. Patient denied vomiting. He states the pain would radiate to chest. Pain was worse with inspiration and after eating. No known alleviating factors. No history of abdominal surgeries. Patient has a known history of gallstones and gastritis.   In the ED, all vitals were stable. LFTs were elevated, both direct and indirect bilirubin were elevated. US  showed cholelithiasis without acute cholecystitis and CBD of 4mm. Given elevated LFTs and elevated bilirubin, MRCP was performed showing peripheral enhancement about distal CBD, suggesting focal inflammatory process. There was motion artifact present distal to common bile duct which limit assessment for choledocholithiasis. GI was consulted to further evaluate for choledocholithiasis.   ERCP was done today and found choledocholithiasis, complete removal was accomplished by Dr. Ole Berkeley.   Surgery is consulted by physician Dr. Ole Berkeley in this context for evaluation and management of chololithiasis and choledocholithiasis.  PAST MEDICAL HISTORY (PMH):  Past Medical History:  Diagnosis Date   Arthritis    Bone spur    NECK   Chronic back pain    Diabetes mellitus without complication (HCC)    Gastritis      PAST SURGICAL HISTORY (PSH):  Past Surgical History:  Procedure Laterality Date   ANTERIOR CERVICAL DECOMP/DISCECTOMY FUSION     C6 to C7     MEDICATIONS:  Prior to Admission medications   Medication Sig Start Date End Date Taking? Authorizing Provider  acetaminophen  (TYLENOL ) 500 MG tablet Take 500 mg by mouth every 6 (six) hours as needed for mild pain (pain score 1-3), fever or headache.   Yes [provider]  calcium carbonate (TUMS - DOSED IN MG ELEMENTAL CALCIUM) 500 MG chewable tablet Chew 1 tablet by mouth daily.   Yes  [provider]  famotidine  (PEPCID ) 20 MG tablet Take 1 tablet (20 mg total) by mouth 2 (two) times daily. 07/26/19 09/05/23 Yes Clevester Dally., MD  metFORMIN (GLUCOPHAGE) 500 MG tablet Take 500 mg by mouth 2 (two) times daily with a meal.   Yes [provider]  amoxicillin  (AMOXIL ) 500 MG capsule Take 1 capsule (500 mg total) by mouth 3 (three) times daily. Patient not taking: Reported on 09/05/2023 11/20/22   Sung, Jade J, MD  HYDROcodone -acetaminophen  (NORCO) 5-325 MG tablet Take 1 tablet by mouth every 6 (six) hours as needed for moderate pain. Patient not taking: Reported on 09/05/2023 11/20/22   Sung, Jade J, MD  phenazopyridine  (PYRIDIUM ) 100 MG tablet Take 1 tablet (100 mg total) by mouth 3 (three) times daily as needed for pain. Patient not taking: Reported on 09/05/2023 07/10/23 07/09/24  Evans, Alexandra, PA-C  sertraline (ZOLOFT) 50 MG tablet Take 50 mg by mouth daily. Patient not taking: Reported on 09/05/2023    [provider]     ALLERGIES:  No Known Allergies   SOCIAL HISTORY:  Social History   Socioeconomic History   Marital status: Single    Spouse name: Not on file   Number of children: Not on file   Years of education: Not on file   Highest education level: Not on file  Occupational History   Not on file  Tobacco Use   Smoking status: Every Day    Current packs/day: 0.50    Types: Cigarettes   Smokeless tobacco: Never  Vaping Use   Vaping status: Never Used  Substance  and Sexual Activity   Alcohol use: No   Drug use: Not Currently   Sexual activity: Not on file  Other Topics Concern   Not on file  Social History Narrative   Not on file   Social Drivers of Health   Financial Resource Strain: Not on file  Food Insecurity: No Food Insecurity (09/05/2023)   Hunger Vital Sign    Worried About Running Out of Food in the Last Year: Never true    Ran Out of Food in the Last Year: Never true  Transportation Needs: No Transportation Needs  (09/05/2023)   PRAPARE - Administrator, Civil Service (Medical): No    Lack of Transportation (Non-Medical): No  Physical Activity: Not on file  Stress: Not on file  Social Connections: Not on file  Intimate Partner Violence: Not At Risk (09/05/2023)   Humiliation, Afraid, Rape, and Kick questionnaire    Fear of Current or Ex-Partner: No    Emotionally Abused: No    Physically Abused: No    Sexually Abused: No     FAMILY HISTORY:  History reviewed. No pertinent family history.    REVIEW OF SYSTEMS:  Review of Systems  Constitutional:  Negative for chills and fever.  Respiratory:  Negative for shortness of breath and wheezing.   Cardiovascular:  Negative for chest pain and palpitations.  Gastrointestinal:  Positive for abdominal pain, heartburn and nausea. Negative for diarrhea and vomiting.    VITAL SIGNS:  Temp:  [96.9 F (36.1 C)-98.1 F (36.7 C)] 98.1 F (36.7 C) (06/09 1414) Pulse Rate:  [61-94] 62 (06/09 1414) Resp:  [10-18] 18 (06/09 1414) BP: (96-136)/(67-89) 113/81 (06/09 1414) SpO2:  [96 %-100 %] 98 % (06/09 1414)     Height: 5\' 9"  (175.3 cm) Weight: 117.9 kg BMI (Calculated): 38.38   INTAKE/OUTPUT:  06/08 0701 - 06/09 0700 In: 4.6 [I.V.:4.6] Out: -   PHYSICAL EXAM:  Physical Exam Constitutional:      Appearance: He is well-developed.  HENT:     Head: Normocephalic and atraumatic.  Cardiovascular:     Rate and Rhythm: Normal rate and regular rhythm.  Pulmonary:     Effort: Pulmonary effort is normal.     Breath sounds: Normal breath sounds.  Abdominal:     Palpations: Abdomen is soft.     Tenderness: There is no abdominal tenderness. There is no guarding or rebound.  Neurological:     Mental Status: He is alert.      Labs:     Latest Ref Rng & Units 09/05/2023   12:56 AM 11/10/2019    4:21 AM 08/31/2019    7:15 AM  CBC  WBC 4.0 - 10.5 K/uL 9.2  11.8  11.9   Hemoglobin 13.0 - 17.0 g/dL 56.2  13.0  86.5   Hematocrit 39.0 - 52.0 % 43.8   41.3  41.9   Platelets 150 - 400 K/uL 185  221  211       Latest Ref Rng & Units 09/05/2023   12:56 AM 11/10/2019    4:21 AM 08/31/2019    7:15 AM  CMP  Glucose 70 - 99 mg/dL 784  696  295   BUN 6 - 20 mg/dL 19  13  10    Creatinine 0.61 - 1.24 mg/dL 2.84  1.32  4.40   Sodium 135 - 145 mmol/L 135  136  135   Potassium 3.5 - 5.1 mmol/L 3.8  3.8  3.8   Chloride 98 - 111  mmol/L 98  102  99   CO2 22 - 32 mmol/L 28  24  27    Calcium 8.9 - 10.3 mg/dL 9.6  9.7  9.5   Total Protein 6.5 - 8.1 g/dL 7.3  7.6  7.2   Total Bilirubin 0.0 - 1.2 mg/dL 2.7  0.6  0.5   Alkaline Phos 38 - 126 U/L 66  52  54   AST 15 - 41 U/L 140  17  16   ALT 0 - 44 U/L 277  30  25      Imaging studies:   CLINICAL DATA:  Right upper quadrant pain   EXAM: 09/05/23 ULTRASOUND ABDOMEN LIMITED RIGHT UPPER QUADRANT   COMPARISON:  None Available.   FINDINGS: Gallbladder:   Gallbladder is well distended with multiple gallstones. No wall thickening or pericholecystic fluid is noted. Negative sonographic Murphy's sign is elicited.   Common bile duct:   Diameter: 4 mm   Liver:   Diffusely increased in echogenicity consistent with fatty infiltration. Portal vein is patent on color Doppler imaging with normal direction of blood flow towards the liver.   Other: None.   IMPRESSION: Fatty liver.   Cholelithiasis without complicating factors.  CLINICAL DATA:  Gallstones with right upper quadrant pain   EXAM: 09/05/23 MRI ABDOMEN WITHOUT AND WITH CONTRAST (INCLUDING MRCP)   TECHNIQUE: Multiplanar multisequence MR imaging of the abdomen was performed both before and after the administration of intravenous contrast. Heavily T2-weighted images of the biliary and pancreatic ducts were obtained, and three-dimensional MRCP images were rendered by post processing.   CONTRAST:  10mL GADAVIST GADOBUTROL 1 MMOL/ML IV SOLN   COMPARISON:  Abdominal sonogram from 09/05/2023   FINDINGS: Lower chest: No acute  abnormality.   Hepatobiliary:   There is diffuse hepatic steatosis. No enhancing liver lesions. Diffusely increased periportal T2 signal.   Multiple stones identified within the gallbladder. The largest is in the fundus measuring 2.6 cm. No gallbladder wall thickening or pericholecystic inflammation. The common bile duct is nondilated. There is peripheral enhancement and increased T2 signal about the distal CBD, image 50/23 and image 47/25. Motion artifact and lack of significant common bile duct dilatation limits assessment for underlying choledocholithiasis. Tiny signal void artifact is noted on the axial T2 weighted images in the expected location of the common bile duct at the level of the head of pancreas, image 21/9 which is equivocal for common bile duct stone and cannot be confirmed on the coronal images or MRCP images.   Pancreas: No mass, inflammatory changes, or other parenchymal abnormality identified.   Spleen:  Within normal limits in size and appearance.   Adrenals/Urinary Tract: Normal adrenal glands. Too small to characterize cyst within the right kidney measures 6 mm, image 14/3. No hydronephrosis or suspicious mass identified bilaterally.   Stomach/Bowel: Visualized portions within the abdomen are unremarkable.   Vascular/Lymphatic: Normal caliber of the abdominal aorta. Upper abdominal vascularity is patent. Portacaval node measures 1.2 cm, image 35/25.   Other:  No free fluid or fluid collections.   Musculoskeletal: No suspicious bone lesions identified.   IMPRESSION: 1. Cholelithiasis without secondary signs of acute cholecystitis. 2. There is peripheral enhancement and increased T2 signal about the distal CBD. Findings suggest focal inflammatory process. This may be the sequelae of occult distal common bile duct stone or recently passed stone. 3. Motion artifact and lack of significant common bile duct dilatation limits assessment for underlying  choledocholithiasis. Tiny signal void artifact is noted on the axial  T2 weighted images in the expected location of the common bile duct at the level of the head of pancreas which is equivocal for common bile duct stone and cannot be confirmed on the motion limited coronal images or MRCP images. 4. Diffuse hepatic steatosis. 5. Diffusely increased periportal T2 signal is nonspecific but can be seen in the setting of hepatitis.   Assessment/Plan: 43 y.o. male with cholelithiasis and choledocholithiasis, complicated by pertinent comorbidities including gastritis and diabetes mellitus type II.    - ERCP treated this episode of choledocholithiasis. Discussed the recommendation of same-admission robotic-assisted laparoscopic cholecystomy, to prevent recurrence of choledocholithiasis or other complications including gallstone pancreatitis, acute cholecystitis, and cholangitis.    -  Informed surgery would be scheduled for tomorrow. Patient did not give consent for surgery. He states he does not wish to stay at the hospital any longer than he needs to. He understand the risk of reoccurrence and other possible complications as listed above. He is interested in scheduling surgery outpatient at a later time.   -  Will schedule an outpatient appointment to further discuss and schedule surgery.   Thank you for the opportunity to participate in this patient's care.   -- Kimball Pence PA-C

## 2023-09-07 ENCOUNTER — Encounter: Payer: Self-pay | Admitting: Gastroenterology

## 2023-09-07 NOTE — Anesthesia Postprocedure Evaluation (Signed)
 Anesthesia Post Note  Patient: Mark Hinton  Procedure(s) Performed: ERCP, WITH INTERVENTION IF INDICATED EGD (ESOPHAGOGASTRODUODENOSCOPY) DILATION, ESOPHAGUS  Patient location during evaluation: Endoscopy Anesthesia Type: General Level of consciousness: awake and alert Pain management: pain level controlled Vital Signs Assessment: post-procedure vital signs reviewed and stable Respiratory status: spontaneous breathing, nonlabored ventilation, respiratory function stable and patient connected to nasal cannula oxygen Cardiovascular status: blood pressure returned to baseline and stable Postop Assessment: no apparent nausea or vomiting Anesthetic complications: no   No notable events documented.   Last Vitals:  Vitals:   09/06/23 1414 09/06/23 1649  BP: 113/81 109/65  Pulse: 62 69  Resp: 18 18  Temp: 36.7 C 36.7 C  SpO2: 98% 96%    Last Pain:  Vitals:   09/06/23 1649  TempSrc: Oral  PainSc:                  Enrique Harvest

## 2023-09-10 ENCOUNTER — Observation Stay
Admission: EM | Admit: 2023-09-10 | Discharge: 2023-09-11 | Disposition: A | Discharge status: Home / Self Care | Payer: Self-pay | Attending: Surgery | Admitting: Surgery

## 2023-09-10 ENCOUNTER — Encounter
Admission: EM | Disposition: A | Discharge status: Home / Self Care | Payer: Self-pay | Source: Home / Self Care | Attending: Emergency Medicine

## 2023-09-10 ENCOUNTER — Observation Stay: Payer: Self-pay

## 2023-09-10 ENCOUNTER — Other Ambulatory Visit: Payer: Self-pay

## 2023-09-10 ENCOUNTER — Emergency Department: Payer: Self-pay

## 2023-09-10 DIAGNOSIS — K811 Chronic cholecystitis: Secondary | ICD-10-CM | POA: Diagnosis present

## 2023-09-10 DIAGNOSIS — F17210 Nicotine dependence, cigarettes, uncomplicated: Secondary | ICD-10-CM | POA: Diagnosis not present

## 2023-09-10 DIAGNOSIS — K8050 Calculus of bile duct without cholangitis or cholecystitis without obstruction: Principal | ICD-10-CM

## 2023-09-10 DIAGNOSIS — E119 Type 2 diabetes mellitus without complications: Secondary | ICD-10-CM | POA: Diagnosis not present

## 2023-09-10 LAB — CBC WITH DIFFERENTIAL/PLATELET
Abs Immature Granulocytes: 0.05 10*3/uL (ref 0.00–0.07)
Basophils Absolute: 0.1 10*3/uL (ref 0.0–0.1)
Basophils Relative: 1 %
Eosinophils Absolute: 0.4 10*3/uL (ref 0.0–0.5)
Eosinophils Relative: 4 %
HCT: 44 % (ref 39.0–52.0)
Hemoglobin: 14.8 g/dL (ref 13.0–17.0)
Immature Granulocytes: 1 %
Lymphocytes Relative: 28 %
Lymphs Abs: 2.8 10*3/uL (ref 0.7–4.0)
MCH: 31.3 pg (ref 26.0–34.0)
MCHC: 33.6 g/dL (ref 30.0–36.0)
MCV: 93 fL (ref 80.0–100.0)
Monocytes Absolute: 0.6 10*3/uL (ref 0.1–1.0)
Monocytes Relative: 6 %
Neutro Abs: 6.3 10*3/uL (ref 1.7–7.7)
Neutrophils Relative %: 60 %
Platelets: 170 10*3/uL (ref 150–400)
RBC: 4.73 MIL/uL (ref 4.22–5.81)
RDW: 12.8 % (ref 11.5–15.5)
WBC: 10.2 10*3/uL (ref 4.0–10.5)
nRBC: 0 % (ref 0.0–0.2)

## 2023-09-10 LAB — GLUCOSE, CAPILLARY
Glucose-Capillary: 84 mg/dL (ref 70–99)
Glucose-Capillary: 134 mg/dL — ABNORMAL HIGH (ref 70–99)
Glucose-Capillary: 134 mg/dL — ABNORMAL HIGH (ref 70–99)
Glucose-Capillary: 175 mg/dL — ABNORMAL HIGH (ref 70–99)

## 2023-09-10 LAB — COMPREHENSIVE METABOLIC PANEL WITH GFR
ALT: 120 U/L — ABNORMAL HIGH (ref 0–44)
AST: 49 U/L — ABNORMAL HIGH (ref 15–41)
Albumin: 3.9 g/dL (ref 3.5–5.0)
Alkaline Phosphatase: 54 U/L (ref 38–126)
Anion gap: 9 (ref 5–15)
BUN: 17 mg/dL (ref 6–20)
CO2: 25 mmol/L (ref 22–32)
Calcium: 9.4 mg/dL (ref 8.9–10.3)
Chloride: 102 mmol/L (ref 98–111)
Creatinine, Ser: 1.07 mg/dL (ref 0.61–1.24)
GFR, Estimated: >60 mL/min (ref >60)
Glucose, Bld: 112 mg/dL — ABNORMAL HIGH (ref 70–99)
Potassium: 4.6 mmol/L (ref 3.5–5.1)
Sodium: 136 mmol/L (ref 135–145)
Total Bilirubin: 1.6 mg/dL — ABNORMAL HIGH (ref 0.0–1.2)
Total Protein: 7.3 g/dL (ref 6.5–8.1)

## 2023-09-10 LAB — BILIRUBIN, DIRECT: Bilirubin, Direct: 0.4 mg/dL — ABNORMAL HIGH (ref 0.0–0.2)

## 2023-09-10 LAB — LIPASE, BLOOD: Lipase: 41 U/L (ref 11–51)

## 2023-09-10 LAB — TROPONIN I (HIGH SENSITIVITY): Troponin I (High Sensitivity): 4 ng/L (ref <18)

## 2023-09-10 SURGERY — CHOLECYSTECTOMY, ROBOT-ASSISTED, LAPAROSCOPIC
Anesthesia: General | Site: Abdomen

## 2023-09-10 MED ORDER — OXYCODONE-ACETAMINOPHEN 5-325 MG PO TABS: 1.0000 | ORAL_TABLET | Freq: Three times a day (TID) | ORAL | 0 refills | Status: AC | PRN

## 2023-09-10 MED ORDER — DEXAMETHASONE SODIUM PHOSPHATE 10 MG/ML IJ SOLN
INTRAMUSCULAR | Status: AC
Start: 2023-09-10 — End: 2023-09-10
  Filled 2023-09-10: qty 1

## 2023-09-10 MED ORDER — ONDANSETRON HCL 4 MG/2ML IJ SOLN: 4.0000 mg | Freq: Four times a day (QID) | INTRAMUSCULAR | Status: DC | PRN

## 2023-09-10 MED ORDER — INDOCYANINE GREEN 25 MG IV SOLR: 1.2500 mg | Freq: Once | INTRAVENOUS | Status: AC

## 2023-09-10 MED ORDER — 0.9 % SODIUM CHLORIDE (POUR BTL) OPTIME
TOPICAL | Status: DC | PRN
  Administered 2023-09-10: 500 mL

## 2023-09-10 MED ORDER — ONDANSETRON 4 MG PO TBDP: 4.0000 mg | ORAL_TABLET | Freq: Four times a day (QID) | ORAL | Status: DC | PRN

## 2023-09-10 MED ORDER — FENTANYL CITRATE (PF) 100 MCG/2ML IJ SOLN
INTRAMUSCULAR | Status: AC
  Filled 2023-09-10: qty 2

## 2023-09-10 MED ORDER — DEXAMETHASONE SODIUM PHOSPHATE 10 MG/ML IJ SOLN
INTRAMUSCULAR | Status: DC | PRN
  Administered 2023-09-10: 4 mg via INTRAVENOUS

## 2023-09-10 MED ORDER — ENOXAPARIN SODIUM 40 MG/0.4ML IJ SOSY
40.0000 mg | PREFILLED_SYRINGE | INTRAMUSCULAR | Status: DC
Start: 2023-09-11 — End: 2023-09-11
  Filled 2023-09-10: qty 0.4

## 2023-09-10 MED ORDER — LIDOCAINE HCL (CARDIAC) PF 100 MG/5ML IV SOSY
PREFILLED_SYRINGE | INTRAVENOUS | Status: DC | PRN
  Administered 2023-09-10: 100 mg via INTRAVENOUS

## 2023-09-10 MED ORDER — SUCCINYLCHOLINE CHLORIDE 200 MG/10ML IV SOSY
PREFILLED_SYRINGE | INTRAVENOUS | Status: AC
  Filled 2023-09-10: qty 10

## 2023-09-10 MED ORDER — INDOCYANINE GREEN 25 MG IV SOLR
INTRAVENOUS | Status: AC
  Filled 2023-09-10: qty 10

## 2023-09-10 MED ORDER — MORPHINE SULFATE (PF) 2 MG/ML IV SOLN
2.0000 mg | INTRAVENOUS | Status: DC | PRN
  Filled 2023-09-10 (×4): qty 1

## 2023-09-10 MED ORDER — INSULIN ASPART 100 UNIT/ML IJ SOLN
0.0000 [IU] | Freq: Three times a day (TID) | INTRAMUSCULAR | Status: DC
  Filled 2023-09-10 (×2): qty 1

## 2023-09-10 MED ORDER — ACETAMINOPHEN 325 MG PO TABS
650.0000 mg | ORAL_TABLET | Freq: Three times a day (TID) | ORAL | Status: DC | PRN
Start: 2023-09-10 — End: 2023-09-11

## 2023-09-10 MED ORDER — KETOROLAC TROMETHAMINE 30 MG/ML IJ SOLN
15.0000 mg | Freq: Once | INTRAMUSCULAR | Status: AC
  Filled 2023-09-10: qty 1

## 2023-09-10 MED ORDER — ROCURONIUM BROMIDE 10 MG/ML (PF) SYRINGE
PREFILLED_SYRINGE | INTRAVENOUS | Status: AC
  Filled 2023-09-10: qty 10

## 2023-09-10 MED ORDER — SODIUM CHLORIDE 0.9 % IV SOLN
2.0000 g | INTRAVENOUS | Status: DC
  Filled 2023-09-10: qty 20

## 2023-09-10 MED ORDER — ONDANSETRON HCL 4 MG/2ML IJ SOLN
INTRAMUSCULAR | Status: AC
Start: 2023-09-10 — End: 2023-09-10
  Filled 2023-09-10: qty 2

## 2023-09-10 MED ORDER — IBUPROFEN 800 MG PO TABS
800.0000 mg | ORAL_TABLET | Freq: Three times a day (TID) | ORAL | 0 refills | Status: AC | PRN
Start: 2023-09-10 — End: ?

## 2023-09-10 MED ORDER — ONDANSETRON HCL 4 MG/2ML IJ SOLN
INTRAMUSCULAR | Status: DC | PRN
  Administered 2023-09-10: 4 mg via INTRAVENOUS

## 2023-09-10 MED ORDER — SUCCINYLCHOLINE CHLORIDE 200 MG/10ML IV SOSY
PREFILLED_SYRINGE | INTRAVENOUS | Status: DC | PRN
  Administered 2023-09-10: 80 mg via INTRAVENOUS

## 2023-09-10 MED ORDER — PROPOFOL 1000 MG/100ML IV EMUL
INTRAVENOUS | Status: AC
  Filled 2023-09-10: qty 100

## 2023-09-10 MED ORDER — ONDANSETRON HCL 4 MG/2ML IJ SOLN
4.0000 mg | INTRAMUSCULAR | Status: AC
  Filled 2023-09-10: qty 2

## 2023-09-10 MED ORDER — MORPHINE SULFATE (PF) 4 MG/ML IV SOLN
4.0000 mg | Freq: Once | INTRAVENOUS | Status: AC
  Filled 2023-09-10: qty 1

## 2023-09-10 MED ORDER — SODIUM CHLORIDE 0.9 % IV SOLN: INTRAVENOUS | Status: DC | PRN

## 2023-09-10 MED ORDER — FENTANYL CITRATE (PF) 100 MCG/2ML IJ SOLN
INTRAMUSCULAR | Status: DC | PRN
  Administered 2023-09-10 (×2): 50 ug via INTRAVENOUS

## 2023-09-10 MED ORDER — OXYCODONE HCL 5 MG PO TABS
ORAL_TABLET | ORAL | Status: AC
Start: 2023-09-10 — End: 2023-09-10
  Filled 2023-09-10: qty 1

## 2023-09-10 MED ORDER — DOCUSATE SODIUM 100 MG PO CAPS: 100.0000 mg | ORAL_CAPSULE | Freq: Two times a day (BID) | ORAL | 0 refills | Status: AC | PRN

## 2023-09-10 MED ORDER — OXYCODONE HCL 5 MG PO TABS: 5.0000 mg | ORAL_TABLET | Freq: Once | ORAL | Status: AC | PRN

## 2023-09-10 MED ORDER — ROCURONIUM BROMIDE 100 MG/10ML IV SOLN
INTRAVENOUS | Status: DC | PRN
  Administered 2023-09-10: 10 mg via INTRAVENOUS
  Administered 2023-09-10: 50 mg via INTRAVENOUS
  Administered 2023-09-10: 10 mg via INTRAVENOUS

## 2023-09-10 MED ORDER — ACETAMINOPHEN 10 MG/ML IV SOLN
INTRAVENOUS | Status: DC | PRN
  Administered 2023-09-10: 1000 mg via INTRAVENOUS

## 2023-09-10 MED ORDER — DOCUSATE SODIUM 100 MG PO CAPS: 100.0000 mg | ORAL_CAPSULE | Freq: Two times a day (BID) | ORAL | Status: DC | PRN

## 2023-09-10 MED ORDER — MIDAZOLAM HCL 2 MG/2ML IJ SOLN
INTRAMUSCULAR | Status: AC
Start: 2023-09-10 — End: 2023-09-10
  Filled 2023-09-10: qty 2

## 2023-09-10 MED ORDER — OXYCODONE HCL 5 MG/5ML PO SOLN: 5.0000 mg | Freq: Once | ORAL | Status: AC | PRN

## 2023-09-10 MED ORDER — PROPOFOL 10 MG/ML IV BOLUS
INTRAVENOUS | Status: AC
  Filled 2023-09-10: qty 20

## 2023-09-10 MED ORDER — LIDOCAINE-EPINEPHRINE (PF) 1 %-1:200000 IJ SOLN
INTRAMUSCULAR | Status: AC
Start: 2023-09-10 — End: 2023-09-10
  Filled 2023-09-10: qty 30

## 2023-09-10 MED ORDER — SUGAMMADEX SODIUM 200 MG/2ML IV SOLN
INTRAVENOUS | Status: DC | PRN
  Administered 2023-09-10: 300 mg via INTRAVENOUS

## 2023-09-10 MED ORDER — HYDROCODONE-ACETAMINOPHEN 5-325 MG PO TABS
1.0000 | ORAL_TABLET | Freq: Three times a day (TID) | ORAL | Status: DC | PRN
  Filled 2023-09-10 (×2): qty 1

## 2023-09-10 MED ORDER — BUPIVACAINE HCL (PF) 0.5 % IJ SOLN
INTRAMUSCULAR | Status: DC | PRN
  Administered 2023-09-10: 48 mL via INTRAMUSCULAR

## 2023-09-10 MED ORDER — PROPOFOL 10 MG/ML IV BOLUS
INTRAVENOUS | Status: DC | PRN
  Administered 2023-09-10: 130 ug/kg/min via INTRAVENOUS
  Administered 2023-09-10: 200 mg via INTRAVENOUS

## 2023-09-10 MED ORDER — PANTOPRAZOLE SODIUM 20 MG PO TBEC
20.0000 mg | DELAYED_RELEASE_TABLET | Freq: Every day | ORAL | Status: DC
  Filled 2023-09-10 (×2): qty 1

## 2023-09-10 MED ORDER — TRAMADOL HCL 50 MG PO TABS: 50.0000 mg | ORAL_TABLET | Freq: Four times a day (QID) | ORAL | Status: DC | PRN

## 2023-09-10 MED ORDER — ACETAMINOPHEN 10 MG/ML IV SOLN
INTRAVENOUS | Status: AC
Start: 2023-09-10 — End: 2023-09-10
  Filled 2023-09-10: qty 100

## 2023-09-10 MED ORDER — MIDAZOLAM HCL 2 MG/2ML IJ SOLN
INTRAMUSCULAR | Status: DC | PRN
  Administered 2023-09-10: 2 mg via INTRAVENOUS

## 2023-09-10 MED ORDER — FENTANYL CITRATE (PF) 100 MCG/2ML IJ SOLN
INTRAMUSCULAR | Status: AC
Start: 2023-09-10 — End: 2023-09-10
  Filled 2023-09-10: qty 2

## 2023-09-10 MED ORDER — FENTANYL CITRATE (PF) 100 MCG/2ML IJ SOLN: 25.0000 ug | INTRAMUSCULAR | Status: DC | PRN

## 2023-09-10 MED ORDER — NICOTINE 7 MG/24HR TD PT24
7.0000 mg | MEDICATED_PATCH | Freq: Every day | TRANSDERMAL | Status: DC
  Filled 2023-09-10 (×2): qty 1

## 2023-09-10 MED ORDER — LIDOCAINE HCL (PF) 2 % IJ SOLN
INTRAMUSCULAR | Status: AC
Start: 2023-09-10 — End: 2023-09-10
  Filled 2023-09-10: qty 5

## 2023-09-10 MED ORDER — BUPIVACAINE HCL (PF) 0.5 % IJ SOLN
INTRAMUSCULAR | Status: AC
  Filled 2023-09-10: qty 30

## 2023-09-10 SURGICAL SUPPLY — 45 items
ANCHOR TIS RET SYS 235ML (MISCELLANEOUS) ×1 IMPLANT
BAG PRESSURE INF REUSE 1000 (BAG) IMPLANT
BLADE CLIPPER SURG (BLADE) IMPLANT
CATH REDDICK CHOLANGI 4FR 50CM (CATHETERS) IMPLANT
CAUTERY HOOK MNPLR 1.6 DVNC XI (INSTRUMENTS) ×1 IMPLANT
CLIP LIGATING HEM O LOK PURPLE (MISCELLANEOUS) IMPLANT
CLIP LIGATING HEMO O LOK GREEN (MISCELLANEOUS) ×1 IMPLANT
DERMABOND ADVANCED .7 DNX12 (GAUZE/BANDAGES/DRESSINGS) ×1 IMPLANT
DRAPE ARM DVNC X/XI (DISPOSABLE) ×4 IMPLANT
DRAPE C-ARM XRAY 36X54 (DRAPES) IMPLANT
DRAPE COLUMN DVNC XI (DISPOSABLE) ×1 IMPLANT
ELECTRODE REM PT RTRN 9FT ADLT (ELECTROSURGICAL) ×1 IMPLANT
FORCEPS BPLR FENES DVNC XI (FORCEP) ×1 IMPLANT
FORCEPS PROGRASP DVNC XI (FORCEP) ×1 IMPLANT
GLOVE BIOGEL PI IND STRL 7.0 (GLOVE) ×2 IMPLANT
GLOVE SURG SYN 6.5 ES PF (GLOVE) ×4 IMPLANT
GLOVE SURG SYN 6.5 PF PI (GLOVE) ×4 IMPLANT
GOWN STRL REUS W/ TWL LRG LVL3 (GOWN DISPOSABLE) ×4 IMPLANT
GRASPER SUT TROCAR 14GX15 (MISCELLANEOUS) IMPLANT
HANDLE YANKAUER SUCT BULB TIP (MISCELLANEOUS) IMPLANT
IRRIGATOR SUCT 8 DISP DVNC XI (IRRIGATION / IRRIGATOR) IMPLANT
IV NS 1000ML BAXH (IV SOLUTION) IMPLANT
KIT TURNOVER KIT A (KITS) ×1 IMPLANT
LABEL OR SOLS (LABEL) ×1 IMPLANT
MANIFOLD NEPTUNE II (INSTRUMENTS) ×1 IMPLANT
NDL HYPO 22X1.5 SAFETY MO (MISCELLANEOUS) ×1 IMPLANT
NDL INSUFFLATION 14GA 120MM (NEEDLE) ×1 IMPLANT
NEEDLE HYPO 22X1.5 SAFETY MO (MISCELLANEOUS) ×1 IMPLANT
NEEDLE INSUFFLATION 14GA 120MM (NEEDLE) ×1 IMPLANT
NS IRRIG 500ML POUR BTL (IV SOLUTION) ×1 IMPLANT
OBTURATOR OPTICALSTD 8 DVNC (TROCAR) ×1 IMPLANT
PACK LAP CHOLECYSTECTOMY (MISCELLANEOUS) ×1 IMPLANT
SEAL UNIV 5-12 XI (MISCELLANEOUS) ×4 IMPLANT
SET TUBE SMOKE EVAC HIGH FLOW (TUBING) ×1 IMPLANT
SOLUTION ELECTROSURG ANTI STCK (MISCELLANEOUS) ×1 IMPLANT
SPIKE FLUID TRANSFER (MISCELLANEOUS) ×2 IMPLANT
SUT MNCRL AB 4-0 PS2 18 (SUTURE) IMPLANT
SUT SILK 3-0 18XBRD TIE 12 (SUTURE) IMPLANT
SUT VIC AB 3-0 SH 27X BRD (SUTURE) IMPLANT
SUT VICRYL 0 UR6 27IN ABS (SUTURE) ×1 IMPLANT
SUTURE MNCRL 4-0 27XMF (SUTURE) ×2 IMPLANT
SYR 30ML LL (SYRINGE) IMPLANT
SYSTEM WECK SHIELD CLOSURE (TROCAR) IMPLANT
TRAP FLUID SMOKE EVACUATOR (MISCELLANEOUS) ×1 IMPLANT
WATER STERILE IRR 500ML POUR (IV SOLUTION) ×1 IMPLANT

## 2023-09-10 NOTE — Interval H&P Note (Signed)
 History and Physical Interval Note:  09/10/2023 1:47 PM  Mark Hinton  has presented today for surgery, with the diagnosis of Chronic cholecystitis.  The various methods of treatment have been discussed with the patient and family. After consideration of risks, benefits and other options for treatment, the patient has consented to  Procedure(s): CHOLECYSTECTOMY, ROBOT-ASSISTED, LAPAROSCOPIC (N/A) as a surgical intervention.  The patient's history has been reviewed, patient examined, no change in status, stable for surgery.  I have reviewed the patient's chart and labs.  Questions were answered to the patient's satisfaction.     Anjana Cheek [name]

## 2023-09-10 NOTE — TOC CM/SW Note (Signed)
 Transition of Care [facility]) - Inpatient Brief Assessment   Patient Details  Name: Kolby Schara MRN: [id number] Date of Birth: [birth year]  Transition of Care [facility]) CM/SW Contact:    Loman Risk, RN Phone Number: 09/10/2023, 10:02 AM   Clinical Narrative:  Transition of Care [facility]) Screening Note   Patient Details  Name: Race Latour Date of Birth: 1981/03/27   Transition of Care [facility]) CM/SW Contact:    Loman Risk, RN Phone Number: 09/10/2023, 10:02 AM    Transition of Care Department [facility]) has reviewed patient and no TOC needs have been identified at this time. If new patient transition needs arise, please place a TOC consult.     Transition of Care Asessment: Insurance and Status: Selfpay Patient has primary care physician: Yes     Prior/Current Home Services: No current home services Social Drivers of Health Review: SDOH reviewed no interventions necessary Readmission risk has been reviewed: Yes Transition of care needs: no transition of care needs at this time

## 2023-09-10 NOTE — Op Note (Signed)
 Preoperative diagnosis:  chronic and cholecystitis  Postoperative diagnosis: same as above  Procedure: Robotic assisted Laparoscopic Cholecystectomy.   Anesthesia: GETA   Surgeon: Conrado Delay  Specimen: Gallbladder  Complications: None  EBL: 15mL  Wound Classification: Clean Contaminated  Indications: see HPI  Findings: Critical view of safety noted Cystic duct and artery identified, ligated and divided, clips remained intact at end of procedure Adequate hemostasis  Description of procedure:  The patient was placed on the operating table in the supine position. SCDs placed, pre-op abx administered.  General anesthesia was induced and OG tube placed by anesthesia. A time-out was completed verifying correct patient, procedure, site, positioning, and implant(s) and/or special equipment prior to beginning this procedure. The abdomen was prepped and draped in the usual sterile fashion.    Veress needle was placed at the Palmer's point and insufflation was started after confirming a positive saline drop test and no immediate increase in abdominal pressure.  After reaching 15 mm, the Veress needle was removed and a 8 mm port was placed via optiview technique under umbilicus measured 20mm from gallbladder.  The abdomen was inspected and no abnormalities or injuries were found.  Under direct vision, ports were placed in the following locations: One 12 mm patient left of the umbilicus, 8cm from the periumbilical port, one 8 mm port placed to the patient right of the umbilical port 8 cm apart.  1 additional 8 mm port placed lateral to the 12mm port.  Once ports were placed, The table was placed in the reverse Trendelenburg position with the right side up. The Xi platform was brought into the operative field and docked to the ports successfully.  An endoscope was placed through the umbilical port, fenestrated grasper through the adjacent patient right port, prograsp to the far patient left port, and  then a hook cautery in the left port.  The dome of the gallbladder was grasped with prograsp, passed and retracted over the dome of the liver. Adhesions between the gallbladder and omentum, duodenum and transverse colon were lysed via hook cautery. The infundibulum was grasped with the fenestrated grasper and retracted toward the right lower quadrant. This maneuver exposed Calot's triangle. The peritoneum overlying the gallbladder infundibulum was then dissected  and the cystic duct and cystic artery identified.  Critical view of safety with the liver bed clearly visible behind the duct and artery with no additional structures noted.  The cystic duct and cystic artery clipped and divided close to the gallbladder.  Large clips used for cystic duct   The gallbladder was then dissected from its peritoneal and liver bed attachments by electrocautery. There was no evidence of bleeding from the gallbladder fossa or cystic artery or leakage of the bile from the cystic duct stump. Endo Catch bag was then placed through the 12 mm port and the gallbladder was removed after extending the 12 mm port site to accommodate the large gallbladder.  Bleeding vessel at the inferior edge during extension of the port site controlled with 3-0 silk tie.  No bleeding was noted afterwards.  The gallbladder was passed off the table as a specimen. The 12 mm port site closed with 0 Vicryl x 2 for the posterior anterior fascia under direct visualization.  3-0 Vicryl used to close the deep dermal layer.  Secondary trocars were removed.  All skin incisions then closed with subcuticular sutures of 4-0 monocryl and dressed with topical skin adhesive. The orogastric tube was removed and patient extubated.  The patient tolerated  the procedure well and was taken to the postanesthesia care unit in stable condition.  All sponge and instrument count correct at end of procedure.

## 2023-09-10 NOTE — Anesthesia Procedure Notes (Signed)
 Procedure Name: Intubation Date/Time: 09/10/2023 2:21 PM  Performed by: Stanford Earl, CRNAPre-anesthesia Checklist: Patient identified, Patient being monitored, Timeout performed, Emergency Drugs available and Suction available Patient Re-evaluated:Patient Re-evaluated prior to induction Oxygen Delivery Method: Circle system utilized Preoxygenation: Pre-oxygenation with 100% oxygen Induction Type: IV induction Ventilation: Mask ventilation without difficulty Laryngoscope Size: Mac and 3 Grade View: Grade I Tube type: Oral Tube size: 7.5 mm Number of attempts: 1 Airway Equipment and Method: Stylet Placement Confirmation: ETT inserted through vocal cords under direct vision, positive ETCO2 and breath sounds checked- equal and bilateral Secured at: 21 cm Tube secured with: Tape Dental Injury: Teeth and Oropharynx as per pre-operative assessment

## 2023-09-10 NOTE — ED Triage Notes (Signed)
 Pt reports he developed chest pain/abd pain tonight. Pt state he had procedure done on Monday to remove gallstones. Pt denies accompanying symptoms

## 2023-09-10 NOTE — Anesthesia Preprocedure Evaluation (Addendum)
 Anesthesia Evaluation  Patient identified by MRN, date of birth, ID band Patient awake    Reviewed: Allergy & Precautions, NPO status , Patient's Chart, lab work & pertinent test results  Airway Mallampati: III  TM Distance: >3 FB Neck ROM: full    Dental  (+) Chipped, Poor Dentition, Missing   Pulmonary Current Smoker and Patient abstained from smoking.   Pulmonary exam normal        Cardiovascular negative cardio ROS Normal cardiovascular exam     Neuro/Psych negative neurological ROS  negative psych ROS   GI/Hepatic Neg liver ROS,GERD  Medicated and Controlled,,  Endo/Other  diabetes    Renal/GU negative Renal ROS  negative genitourinary   Musculoskeletal   Abdominal   Peds  Hematology negative hematology ROS (+)   Anesthesia Other Findings Past Medical History: No date: Arthritis No date: Bone spur     Comment:  NECK No date: Chronic back pain No date: Diabetes mellitus without complication (HCC) No date: Gastritis  Past Surgical History: No date: ANTERIOR CERVICAL DECOMP/DISCECTOMY FUSION     Comment:  C6 to C7  BMI    Body Mass Index: 38.40 kg/m      Reproductive/Obstetrics negative OB ROS                             Anesthesia Physical Anesthesia Plan  ASA: 2  Anesthesia Plan: General   Post-op Pain Management: Minimal or no pain anticipated   Induction: Intravenous  PONV Risk Score and Plan: 3 and Ondansetron , Midazolam and Dexamethasone  Airway Management Planned: Oral ETT  Additional Equipment: None  Intra-op Plan:   Post-operative Plan: Extubation in OR  Informed Consent: I have reviewed the patients History and Physical, chart, labs and discussed the procedure including the risks, benefits and alternatives for the proposed anesthesia with the patient or authorized representative who has indicated his/her understanding and acceptance.     Dental  advisory given  Plan Discussed with: CRNA and Surgeon  Anesthesia Plan Comments: (Patient consented for risks of anesthesia including but not limited to:  - adverse reactions to medications - damage to eyes, teeth, lips or other oral mucosa - nerve damage due to positioning  - sore throat or hoarseness - Damage to heart, brain, nerves, lungs, other parts of body or loss of life  Patient voiced understanding and assent.)       Anesthesia Quick Evaluation

## 2023-09-10 NOTE — Discharge Instructions (Signed)
 Laparoscopic Cholecystectomy, Care After This sheet gives you information about how to care for yourself after your procedure. Your doctor may also give you more specific instructions. If you have problems or questions, contact your doctor. Follow these instructions at home: Care for cuts from surgery (incisions)  Follow instructions from your doctor about how to take care of your cuts from surgery. Make sure you: Wash your hands with soap and water before you change your bandage (dressing). If you cannot use soap and water, use hand sanitizer. Change your bandage as told by your doctor. Leave stitches (sutures), skin glue, or skin tape (adhesive) strips in place. They may need to stay in place for 2 weeks or longer. If tape strips get loose and curl up, you may trim the loose edges. Do not remove tape strips completely unless your doctor says it is okay. Do not take baths, swim, or use a hot tub until your doctor says it is okay. OK TO SHOWER 24HRS AFTER YOUR SURGERY.  Check your surgical cut area every day for signs of infection. Check for: More redness, swelling, or pain. More fluid or blood. Warmth. Pus or a bad smell. Activity Do not drive or use heavy machinery while taking prescription pain medicine. Do not play contact sports until your doctor says it is okay. Do not drive for 24 hours if you were given a medicine to help you relax (sedative). Rest as needed. Do not return to work or school until your doctor says it is okay. General instructions  tylenol and advil as needed for discomfort.  Please alternate between the two every four hours as needed for pain.    Use narcotics, if prescribed, only when tylenol and motrin is not enough to control pain.  325-650mg  every 8hrs to max of 3000mg /24hrs (including the 325mg  in every norco dose) for the tylenol.    Advil up to 800mg  per dose every 8hrs as needed for pain.   To prevent or treat constipation while you are taking prescription  pain medicine, your doctor may recommend that you: Drink enough fluid to keep your pee (urine) clear or pale yellow. Take over-the-counter or prescription medicines. Eat foods that are high in fiber, such as fresh fruits and vegetables, whole grains, and beans. Limit foods that are high in fat and processed sugars, such as fried and sweet foods. Contact a doctor if: You develop a rash. You have more redness, swelling, or pain around your surgical cuts. You have more fluid or blood coming from your surgical cuts. Your surgical cuts feel warm to the touch. You have pus or a bad smell coming from your surgical cuts. You have a fever. One or more of your surgical cuts breaks open. You have trouble breathing. You have chest pain. You have pain that is getting worse in your shoulders. You faint or feel dizzy when you stand. You have very bad pain in your belly (abdomen). You are sick to your stomach (nauseous) for more than one day. You have throwing up (vomiting) that lasts for more than one day. You have leg pain. This information is not intended to replace advice given to you by your health care provider. Make sure you discuss any questions you have with your health care provider. Document Released: 12/24/2007 Document Revised: 10/05/2015 Document Reviewed: 09/02/2015 Elsevier Interactive Patient Education  2019 [employer].

## 2023-09-10 NOTE — H&P (Signed)
 Subjective:   CC: Chronic cholecystitis  HPI:  Mark Hinton is a 43 y.o. male who is consulted by Lajuana Pilar for evaluation of above cc.  Symptoms were first noted 1 days ago. Pain is sharp, right upper quadrant associated with nausea, exacerbated by nothing.  S/p ERCP approximately 1 week ago for same issue.  Patient initially offered cholecystectomy at that time but declined.  Returns today for repeat issue.     Past Medical History:  has a past medical history of Arthritis, Bone spur, Chronic back pain, Diabetes mellitus without complication (HCC), and Gastritis.  Past Surgical History:  has a past surgical history that includes Anterior cervical decomp/discectomy fusion; ERCP (N/A, 09/06/2023); Esophagogastroduodenoscopy (N/A, 09/06/2023); and Esophageal dilation (09/06/2023).  Family History: family history is not on file.  Social History:  reports that he has been smoking cigarettes. He has never used smokeless tobacco. He reports that he does not currently use drugs. He reports that he does not drink alcohol.  Current Medications:  Prior to Admission medications   Medication Sig Start Date End Date Taking? Authorizing Provider  acetaminophen  (TYLENOL ) 500 MG tablet Take 500 mg by mouth every 6 (six) hours as needed for mild pain (pain score 1-3), fever or headache.   Yes [provider]  calcium carbonate (TUMS - DOSED IN MG ELEMENTAL CALCIUM) 500 MG chewable tablet Chew 1 tablet by mouth daily.   Yes [provider]  famotidine  (PEPCID ) 20 MG tablet Take 1 tablet (20 mg total) by mouth 2 (two) times daily. 07/26/19 09/10/23 Yes Clevester Dally., MD  metFORMIN (GLUCOPHAGE) 500 MG tablet Take 500 mg by mouth 2 (two) times daily with a meal.   Yes [provider]  phenazopyridine  (PYRIDIUM ) 100 MG tablet Take 1 tablet (100 mg total) by mouth 3 (three) times daily as needed for pain. Patient not taking: Reported on 09/05/2023 07/10/23 07/09/24  Evans, Alexandra, PA-C     Allergies:  Allergies as of 09/10/2023   (No Known Allergies)    ROS:  Pertinent positives and negatives noted in HPI   Objective:     BP 111/67 (BP Location: Left Arm)   Pulse (!) 55   Temp 98 F (36.7 C) (Oral)   Resp 16   Ht 5' 9 (1.753 m)   Wt 117.9 kg   SpO2 98%   BMI 38.40 kg/m    Constitutional :  alert, cooperative, appears stated age, and no distress  Respiratory:  Clear to auscultation bilaterally  Cardiovascular:  Regular rate and rhythm  Gastrointestinal: Soft, no guarding, right upper quadrant tenderness to palpation.   Skin: Cool and moist  Psychiatric: Normal affect, non-agitated, not confused       LABS:     Latest Ref Rng & Units 09/10/2023    4:40 AM 09/05/2023   12:56 AM 11/10/2019    4:21 AM  CMP  Glucose 70 - 99 mg/dL 604  540  981   BUN 6 - 20 mg/dL 17  19  13    Creatinine 0.61 - 1.24 mg/dL 1.91  4.78  2.95   Sodium 135 - 145 mmol/L 136  135  136   Potassium 3.5 - 5.1 mmol/L 4.6  3.8  3.8   Chloride 98 - 111 mmol/L 102  98  102   CO2 22 - 32 mmol/L 25  28  24    Calcium 8.9 - 10.3 mg/dL 9.4  9.6  9.7   Total Protein 6.5 - 8.1 g/dL 7.3  7.3  7.6   Total Bilirubin 0.0 - 1.2 mg/dL 1.6  2.7  0.6   Alkaline Phos 38 - 126 U/L 54  66  52   AST 15 - 41 U/L 49  140  17   ALT 0 - 44 U/L 120  277  30       Latest Ref Rng & Units 09/10/2023    4:40 AM 09/05/2023   12:56 AM 11/10/2019    4:21 AM  CBC  WBC 4.0 - 10.5 K/uL 10.2  9.2  11.8   Hemoglobin 13.0 - 17.0 g/dL 16.1  09.6  04.5   Hematocrit 39.0 - 52.0 % 44.0  43.8  41.3   Platelets 150 - 400 K/uL 170  185  221      RADS: CLINICAL DATA:  Upper abdominal and chest pain for 4 days.   EXAM: ULTRASOUND ABDOMEN LIMITED RIGHT UPPER QUADRANT   COMPARISON:  MRI abdomen 09/05/2023   FINDINGS: Gallbladder:   Gallstones evident measuring up to 2.8 cm. Gallbladder wall is mildly thickened at 4 mm. No substantial pericholecystic fluid. The sonographer reports no sonographic Murphy sign.    Common bile duct:   Diameter: 5-6 mm   Liver:   Increased echogenicity of liver parenchyma suggests fatty deposition. Portal vein is patent on color Doppler imaging with normal direction of blood flow towards the liver.   Other: None.   IMPRESSION: 1. Cholelithiasis with mild gallbladder wall thickening. No substantial pericholecystic fluid. The sonographer reports no sonographic Murphy sign. Close clinical correlation for signs/symptoms of acute cholecystitis recommended. 2. Increased echogenicity of liver parenchyma suggests fatty deposition.     Electronically Signed   By: Donnal Fusi M.D.   On: 09/10/2023 06:14 Assessment:      Chronic cholecystitis  Plan:    Elevated LFTs noted but direct bilirubin is relatively low.  Doubt any recurrent common bile duct stone.  Will proceed with robotic assisted laparoscopic cholecystectomy at this time to prevent further issues.  Discussed the risk of surgery including post-op infxn, seroma, biloma, chronic pain, poor-delayed wound healing, retained gallstone, conversion to open procedure, post-op SBO or ileus, and need for additional procedures to address said risks.  The risks of general anesthetic including MI, CVA, sudden death or even reaction to anesthetic medications also discussed. Alternatives include continued observation.  Benefits include possible symptom relief, prevention of complications including acute cholecystitis, pancreatitis.  Typical post operative recovery of 3-5 days rest, continued pain in area and incision sites, possible loose stools up to 4-6 weeks, also discussed.  The patient understands the risks, any and all questions were answered to the patient's satisfaction.  labs/images/medications/previous chart entries reviewed personally and relevant changes/updates noted above.

## 2023-09-10 NOTE — Transfer of Care (Signed)
 Immediate Anesthesia Transfer of Care Note  Patient: Mark Hinton  Procedure(s) Performed: CHOLECYSTECTOMY, ROBOT-ASSISTED, LAPAROSCOPIC (Abdomen)  Patient Location: PACU  Anesthesia Type:General  Level of Consciousness: awake, alert , and oriented  Airway & Oxygen Therapy: Patient Spontanous Breathing and Patient connected to face mask oxygen  Post-op Assessment: Report given to RN and Post -op Vital signs reviewed and stable  Post vital signs: Reviewed and stable  Last Vitals:  Vitals Value Taken Time  BP 116/72 09/10/23 16:05  Temp    Pulse 66 09/10/23 16:05  Resp 16 09/10/23 16:05  SpO2 99 % 09/10/23 16:05  Vitals shown include unfiled device data.  Last Pain:  Vitals:   09/10/23 1359  TempSrc: Temporal  PainSc: 0-No pain         Complications: There were no known notable events for this encounter.

## 2023-09-10 NOTE — Anesthesia Postprocedure Evaluation (Signed)
 Anesthesia Post Note  Patient: Mark Hinton  Procedure(s) Performed: CHOLECYSTECTOMY, ROBOT-ASSISTED, LAPAROSCOPIC (Abdomen)  Patient location during evaluation: PACU Anesthesia Type: General Level of consciousness: awake and alert Pain management: pain level controlled Vital Signs Assessment: post-procedure vital signs reviewed and stable Respiratory status: spontaneous breathing, nonlabored ventilation, respiratory function stable and patient connected to nasal cannula oxygen Cardiovascular status: blood pressure returned to baseline and stable Postop Assessment: no apparent nausea or vomiting Anesthetic complications: no   There were no known notable events for this encounter.   Last Vitals:  Vitals:   09/10/23 1645 09/10/23 1700  BP: 121/88 119/75  Pulse: (!) 58 (!) 53  Resp: 14 11  Temp:  (!) 36.3 C  SpO2: 95% 95%    Last Pain:  Vitals:   09/10/23 1700  TempSrc:   PainSc: 6                  Portia Brittle Olden Klauer

## 2023-09-10 NOTE — Plan of Care (Signed)

## 2023-09-10 NOTE — ED Provider Notes (Signed)
 [facility] Provider Note    Event Date/Time   First MD Initiated Contact with Patient 09/10/23 0421     (approximate)   History   Abdominal Pain and Chest Pain   HPI Mark Hinton is a 43 y.o. male who presents for evaluation of acute onset upper abdominal pain radiating into his chest and his back.  He was admitted to the hospital and just discharged about 4 days ago for biliary colic and choledocholithiasis.  He had an ERCP and it was recommended to him that he have a cholecystectomy, but he declined the surgery and was discharged about 4 days ago.  He was feeling better until he woke up in the middle the night tonight with pain that he says feels like it did before he went into the hospital.  Some nausea, no vomiting.  Denies fever and shortness of breath.  No lower abdominal pain.     Physical Exam   Triage Vital Signs: ED Triage Vitals  Encounter Vitals Group     BP 09/10/23 0416 (!) 128/90     Girls Systolic BP Percentile --      Girls Diastolic BP Percentile --      Boys Systolic BP Percentile --      Boys Diastolic BP Percentile --      Pulse Rate 09/10/23 0416 73     Resp 09/10/23 0416 18     Temp 09/10/23 0416 98.1 F (36.7 C)     Temp src --      SpO2 09/10/23 0416 97 %     Weight 09/10/23 0414 117.9 kg (260 lb)     Height 09/10/23 0414 1.753 m (5' 9)     Head Circumference --      Peak Flow --      Pain Score 09/10/23 0414 7     Pain Loc --      Pain Education --      Exclude from Growth Chart --     Most recent vital signs: Vitals:   09/10/23 0615 09/10/23 0640  BP:    Pulse: (!) 54 62  Resp: 13 13  Temp:    SpO2: 95% 93%    General: Awake, appears a bit uncomfortable but not in severe distress. CV:  Good peripheral perfusion.  Regular rate and rhythm.  Normal heart sounds. Resp:  Normal effort. Speaking easily and comfortably, no accessory muscle usage nor intercostal retractions.  Lungs clear to auscultation  bilaterally. Abd:   soft, nondistended.  No lower abdominal tenderness with no guarding.  Tenderness to palpation of the epigastrium and right upper quadrant with positive Murphy sign.  No pulsatile abdominal masses.   ED Results / Procedures / Treatments   Labs (all labs ordered are listed, but only abnormal results are displayed) Labs Reviewed  COMPREHENSIVE METABOLIC PANEL WITH GFR - Abnormal; Notable for the following components:      Result Value   Glucose, Bld 112 (*)    AST 49 (*)    ALT 120 (*)    Total Bilirubin 1.6 (*)    All other components within normal limits  CBC WITH DIFFERENTIAL/PLATELET  LIPASE, BLOOD  TROPONIN I (HIGH SENSITIVITY)     EKG  ED ECG REPORT I, Lynnda Sas, the attending physician, personally viewed and interpreted this ECG.  Date: 09/10/2023 EKG Time: 4:18 AM Rate: 69 Rhythm: normal sinus rhythm QRS Axis: normal Intervals: normal ST/T Wave abnormalities: normal Narrative Interpretation: no evidence of acute  ischemia    RADIOLOGY See ED course for details   PROCEDURES:  Critical Care performed: No  Procedures    IMPRESSION / MDM / ASSESSMENT AND PLAN / ED COURSE  I reviewed the triage vital signs and the nursing notes.                              Differential diagnosis includes, but is not limited to, retained stone, pancreatitis, cholecystitis, biliary colic, free air/perforation from ERCP, less likely pneumonia or PE.  Patient's presentation is most consistent with acute presentation with potential threat to life or bodily function.  Labs/studies ordered: CBC with differential, EKG, lipase, high-sensitivity troponin, CMP, right upper quadrant abdominal ultrasound, acute chest series of x-rays  Interventions/Medications given:  Medications  morphine  (PF) 4 MG/ML injection 4 mg (4 mg Intravenous Given 09/10/23 0504)  ondansetron  (ZOFRAN ) injection 4 mg (4 mg Intravenous Given 09/10/23 0505)  ketorolac  (TORADOL ) 30 MG/ML  injection 15 mg (15 mg Intravenous Given 09/10/23 0505)    (Note:  hospital course my include additional interventions and/or labs/studies not listed above.)   Suspect recurrent biliary colic although gallbladder pancreatitis or even cholecystitis is possible.  Free air from procedure is less likely 4 to 5 days after the procedure but I will check with an acute abdomen series.  Will repeat ultrasound to compare appearance of gallbladder tonight with his preprocedure ultrasound.  Labs pending.  Morphine  IV, Toradol  IV, Zofran  IV as documented above for pain and nausea control.  Patient agrees with plan.  Hemodynamically stable.    Clinical Course as of 09/10/23 0648  Fri Sep 10, 2023  0634 US  ABDOMEN LIMITED RUQ (LIVER/GB) I independently viewed and interpreted the patient's ultrasound.  I am somewhat concerned about the gallbladder wall thickening.  Radiologist pointed out some gallbladder wall thickening but no significant pericholecystic fluid.  I reassessed the patient and he still has some pain and tenderness to palpation with minimal guarding to the right upper quadrant.  I had a long conversation with the patient and he said that if offered a cholecystectomy, he would except at this point.  I consulted by phone with Dr. [name] with general surgery.  He said to put in the consult order and he will discuss with the previous team that saw the patient the last time he was here.  Someone will see the patient after 7 AM and will advise the oncoming ED physician about the disposition plan.  I updated the patient and his wife and they understand and agree with the current plan. [CF]  [phone] I also independently viewed and interpreted the patient's acute abdomen series.  I see no evidence of an acute or emergent issue.  The radiologist called a possible small bowel loop that may be consistent with ileus, but that this does not correlate clinically and I am not concerned that the patient needs a CT scan.   When I reassessed him, he has no tenderness to palpation of the lower abdomen, is passing gas and having bowel movements, and is not vomiting. [CF]    Clinical Course User Index [CF] Lynnda Sas, MD     FINAL CLINICAL IMPRESSION(S) / ED DIAGNOSES   Final diagnoses:  Biliary colic     Rx / DC Orders   ED Discharge Orders     None        Note:  This document was prepared using Dragon voice recognition software and may  include unintentional dictation errors.   Lynnda Sas, MD 09/10/23 [phone]

## 2023-09-11 LAB — BASIC METABOLIC PANEL WITH GFR
Anion gap: 10 (ref 5–15)
BUN: 16 mg/dL (ref 6–20)
CO2: 23 mmol/L (ref 22–32)
Calcium: 9.2 mg/dL (ref 8.9–10.3)
Chloride: 104 mmol/L (ref 98–111)
Creatinine, Ser: 0.92 mg/dL (ref 0.61–1.24)
GFR, Estimated: >60 mL/min (ref >60)
Glucose, Bld: 134 mg/dL — ABNORMAL HIGH (ref 70–99)
Potassium: 4.3 mmol/L (ref 3.5–5.1)
Sodium: 137 mmol/L (ref 135–145)

## 2023-09-11 LAB — CBC
HCT: 42.7 % (ref 39.0–52.0)
Hemoglobin: 14.4 g/dL (ref 13.0–17.0)
MCH: 30.8 pg (ref 26.0–34.0)
MCHC: 33.7 g/dL (ref 30.0–36.0)
MCV: 91.4 fL (ref 80.0–100.0)
Platelets: 190 10*3/uL (ref 150–400)
RBC: 4.67 MIL/uL (ref 4.22–5.81)
RDW: 12.7 % (ref 11.5–15.5)
WBC: 13.9 10*3/uL — ABNORMAL HIGH (ref 4.0–10.5)
nRBC: 0 % (ref 0.0–0.2)

## 2023-09-11 LAB — HEPATIC FUNCTION PANEL
ALT: 113 U/L — ABNORMAL HIGH (ref 0–44)
AST: 40 U/L (ref 15–41)
Albumin: 3.7 g/dL (ref 3.5–5.0)
Alkaline Phosphatase: 58 U/L (ref 38–126)
Bilirubin, Direct: 0.2 mg/dL (ref 0.0–0.2)
Indirect Bilirubin: 1 mg/dL — ABNORMAL HIGH (ref 0.3–0.9)
Total Bilirubin: 1.2 mg/dL (ref 0.0–1.2)
Total Protein: 7.3 g/dL (ref 6.5–8.1)

## 2023-09-11 LAB — GLUCOSE, CAPILLARY: Glucose-Capillary: 167 mg/dL — ABNORMAL HIGH (ref 70–99)

## 2023-09-11 NOTE — Plan of Care (Signed)

## 2023-09-11 NOTE — Progress Notes (Signed)
 IV removed. Discharge instructions reviewed. Patient verbalized understanding. Waiting for ride.  Mark Hinton V Mark Hinton

## 2023-09-11 NOTE — Discharge Summary (Signed)
 Patient ID: Maysen Sudol MRN: [id number] DOB/AGE: 1980/10/17 43 y.o.  Admit date: 09/10/2023 Discharge date: 09/11/2023   Discharge Diagnoses:  Principal Problem:   Chronic cholecystitis   Procedures:Robotic cholecystectomy  Hospital Course:   admitted with findings consistent with acute cholecystitis  prior ERCp, refused cholecystectomy on prior hospitalization and sxs recurrent, he was admitted, resuscitated and  was taken promptly to the operating room for an uneventful laparoscopic  Robotic cholecystectomy .  Patient was kept overnight. At The time of discharge the patient was ambulating,  pain was controlled.  His vital signs were stable and she was afebrile.   physical exam at discharge showed a pt  in no acute distress.  Awake and alert.  Abdomen: Soft incisions healing well without infection or peritonitis.  Extremities well-perfused and no edema.  Condition of the patient the time of discharge was stable   Disposition: Discharge disposition: 01-Home or Self Care       Discharge Instructions     Call MD for:  difficulty breathing, headache or visual disturbances   Complete by: As directed    Call MD for:  extreme fatigue   Complete by: As directed    Call MD for:  hives   Complete by: As directed    Call MD for:  persistant dizziness or light-headedness   Complete by: As directed    Call MD for:  persistant nausea and vomiting   Complete by: As directed    Call MD for:  redness, tenderness, or signs of infection (pain, swelling, redness, odor or green/yellow discharge around incision site)   Complete by: As directed    Call MD for:  severe uncontrolled pain   Complete by: As directed    Call MD for:  temperature >100.4   Complete by: As directed    Diet - low sodium heart healthy   Complete by: As directed    Discharge instructions   Complete by: As directed    Shower starting tomorrow, pt may be excused from work until 09/27/23 due to medical needs   Increase  activity slowly   Complete by: As directed    Lifting restrictions   Complete by: As directed    20 lbs x 6 wks      Allergies as of 09/11/2023   No Known Allergies      Medication List     STOP taking these medications    phenazopyridine  100 MG tablet Commonly known as: PYRIDIUM        TAKE these medications    acetaminophen  500 MG tablet Commonly known as: TYLENOL  Take 500 mg by mouth every 6 (six) hours as needed for mild pain (pain score 1-3), fever or headache.   calcium carbonate 500 MG chewable tablet Commonly known as: TUMS - dosed in mg elemental calcium Chew 1 tablet by mouth daily.   docusate sodium 100 MG capsule Commonly known as: Colace Take 1 capsule (100 mg total) by mouth 2 (two) times daily as needed for up to 10 days for mild constipation.   famotidine  20 MG tablet Commonly known as: PEPCID  Take 1 tablet (20 mg total) by mouth 2 (two) times daily.   ibuprofen  800 MG tablet Commonly known as: ADVIL  Take 1 tablet (800 mg total) by mouth every 8 (eight) hours as needed for mild pain (pain score 1-3) or moderate pain (pain score 4-6).   metFORMIN 500 MG tablet Commonly known as: GLUCOPHAGE Take 500 mg by mouth 2 (two) times daily with a  meal.   oxyCODONE -acetaminophen  5-325 MG tablet Commonly known as: Percocet Take 1 tablet by mouth every 8 (eight) hours as needed for severe pain (pain score 7-10).        Follow-up Information     [name], Isami, DO Follow up in 3 week(s).   Specialties: General Surgery, Surgery Why: Postop cholecystectomy Call to schedule appointment. Contact information: [address] [place] [address] [phone]                  Evelia Hipp, MD FACS

## 2023-09-14 LAB — SURGICAL PATHOLOGY

## 2023-09-17 ENCOUNTER — Encounter: Payer: Self-pay | Admitting: Emergency Medicine

## 2023-09-17 ENCOUNTER — Emergency Department
Admission: EM | Admit: 2023-09-17 | Discharge: 2023-09-18 | Disposition: A | Discharge status: Home / Self Care | Payer: Self-pay | Attending: Emergency Medicine | Admitting: Emergency Medicine

## 2023-09-17 ENCOUNTER — Other Ambulatory Visit: Payer: Self-pay

## 2023-09-17 ENCOUNTER — Emergency Department: Payer: Self-pay

## 2023-09-17 DIAGNOSIS — S39012A Strain of muscle, fascia and tendon of lower back, initial encounter: Secondary | ICD-10-CM | POA: Diagnosis not present

## 2023-09-17 DIAGNOSIS — R109 Unspecified abdominal pain: Secondary | ICD-10-CM | POA: Diagnosis not present

## 2023-09-17 DIAGNOSIS — W228XXA Striking against or struck by other objects, initial encounter: Secondary | ICD-10-CM | POA: Diagnosis not present

## 2023-09-17 DIAGNOSIS — E119 Type 2 diabetes mellitus without complications: Secondary | ICD-10-CM | POA: Diagnosis not present

## 2023-09-17 DIAGNOSIS — S3992XA Unspecified injury of lower back, initial encounter: Secondary | ICD-10-CM | POA: Diagnosis present

## 2023-09-17 LAB — CBC
HCT: 43.9 % (ref 39.0–52.0)
Hemoglobin: 14.8 g/dL (ref 13.0–17.0)
MCH: 31.4 pg (ref 26.0–34.0)
MCHC: 33.7 g/dL (ref 30.0–36.0)
MCV: 93 fL (ref 80.0–100.0)
Platelets: 226 10*3/uL (ref 150–400)
RBC: 4.72 MIL/uL (ref 4.22–5.81)
RDW: 12.5 % (ref 11.5–15.5)
WBC: 11.1 10*3/uL — ABNORMAL HIGH (ref 4.0–10.5)
nRBC: 0 % (ref 0.0–0.2)

## 2023-09-17 LAB — COMPREHENSIVE METABOLIC PANEL WITH GFR
ALT: 41 U/L (ref 0–44)
AST: 22 U/L (ref 15–41)
Albumin: 4 g/dL (ref 3.5–5.0)
Alkaline Phosphatase: 60 U/L (ref 38–126)
Anion gap: 9 (ref 5–15)
BUN: 12 mg/dL (ref 6–20)
CO2: 23 mmol/L (ref 22–32)
Calcium: 8.9 mg/dL (ref 8.9–10.3)
Chloride: 104 mmol/L (ref 98–111)
Creatinine, Ser: 1.02 mg/dL (ref 0.61–1.24)
GFR, Estimated: >60 mL/min (ref >60)
Glucose, Bld: 157 mg/dL — ABNORMAL HIGH (ref 70–99)
Potassium: 3.8 mmol/L (ref 3.5–5.1)
Sodium: 136 mmol/L (ref 135–145)
Total Bilirubin: 0.7 mg/dL (ref 0.0–1.2)
Total Protein: 7.5 g/dL (ref 6.5–8.1)

## 2023-09-17 LAB — LIPASE, BLOOD: Lipase: 48 U/L (ref 11–51)

## 2023-09-17 NOTE — ED Provider Notes (Signed)
 [facility] Provider Note    Event Date/Time   First MD Initiated Contact with Patient 09/17/23 2311     (approximate)   History   Flank Pain   HPI  Mark Hinton is a 43 y.o. male   Past medical history of chronic back pain, diabetes, recent ERCP and cholecystectomy who presents to the Emergency Department with left-sided lower back pain after he stumbled and thinks he might of pulled a muscle in his back.    He had no direct trauma to the area.    He has no radiation pain.    His postcholecystectomy abdominal pain and wounds are healing appropriately, not worsened by this stumbling/fall.    He denies urinary symptoms.     External Medical Documents Reviewed: Surgery notes from cholecystectomy earlier this month      Physical Exam   Triage Vital Signs: ED Triage Vitals  Encounter Vitals Group     BP 09/17/23 2005 (!) 143/87     Girls Systolic BP Percentile --      Girls Diastolic BP Percentile --      Boys Systolic BP Percentile --      Boys Diastolic BP Percentile --      Pulse Rate 09/17/23 2005 87     Resp 09/17/23 2005 18     Temp 09/17/23 2005 (!) 97 F (36.1 C)     Temp Source 09/17/23 2005 Oral     SpO2 09/17/23 2005 97 %     Weight 09/17/23 2006 145 lb (65.8 kg)     Height 09/17/23 2006 5' 9 (1.753 m)     Head Circumference --      Peak Flow --      Pain Score 09/17/23 2006 6     Pain Loc --      Pain Education --      Exclude from Growth Chart --     Most recent vital signs: Vitals:   09/17/23 2005 09/18/23 0030  BP: (!) 143/87 128/83  Pulse: 87 85  Resp: 18 16  Temp: (!) 97 F (36.1 C)   SpO2: 97% 97%    General: Awake, no distress.  CV:  Good peripheral perfusion.  Resp:  Normal effort.  Abd:  No distention.  Other:  Awake alert comfortable appearing in no acute distress.  Surgical sites are healing appropriately with no signs of infection.  Soft benign abdominal exam.  Palpation all quadrants.  Mild  left-sided paraspinal tenderness to the lumbar area.  No step-off or deformity or tenderness to the midline lumbar area.   ED Results / Procedures / Treatments   Labs (all labs ordered are listed, but only abnormal results are displayed) Labs Reviewed  COMPREHENSIVE METABOLIC PANEL WITH GFR - Abnormal; Notable for the following components:      Result Value   Glucose, Bld 157 (*)    All other components within normal limits  CBC - Abnormal; Notable for the following components:   WBC 11.1 (*)    All other components within normal limits  URINALYSIS, W/ REFLEX TO CULTURE (INFECTION SUSPECTED) - Abnormal; Notable for the following components:   Color, Urine YELLOW (*)    APPearance CLEAR (*)    Hgb urine dipstick SMALL (*)    All other components within normal limits  LIPASE, BLOOD     I ordered and reviewed the above labs they are notable for cell counts electrolytes unremarkable aside from very mildly elevated white blood  cell count.   RADIOLOGY I independently reviewed and interpreted CT of the abdomen pelvis see no obvious obstructive or inflammatory changes I also reviewed radiologist's formal read.   PROCEDURES:  Critical Care performed: No  Procedures   MEDICATIONS ORDERED IN ED: Medications - No data to display   IMPRESSION / MDM / ASSESSMENT AND PLAN / ED COURSE  I reviewed the triage vital signs and the nursing notes.                                Patient's presentation is most consistent with acute presentation with potential threat to life or bodily function.  Differential diagnosis includes, but is not limited to, lumbar strain, kidney stone, considered but less likely post surgical complications like infection, bleeding, retained stone   The patient is on the cardiac monitor to evaluate for evidence of arrhythmia and/or significant heart rate changes.  MDM:    Patient with most likely lumbar strain after he had a stumbling incident in thinks he  might of strained his back, symptoms consistent with that as well.  No red flag symptoms to suggest cord compromise nor midline tenderness or direct trauma to suggest fracture.  No urinary symptoms suggest infection.  Given his recent ERCP and cholecystectomy I considered intra-abdominal infection or postsurgical complications but doubtful given there is no increase in pain in those areas, no infectious changes of the wounds, and a very benign abdominal exam, normal appearing LFTs and bilirubin and alk phos so doubtful retained stone.  CT imaging shows no marked changes except for expected postsurgical changes, no kidney stone, and pain is moderately well-controlled.  I considered hospitalization for admission or observation given moderately well-controlled pain in the emergency department and no emergency findings on evaluation as above I think he can be managed as outpatient and follow-up with the surgery for postsurgical follow-up as scheduled.        FINAL CLINICAL IMPRESSION(S) / ED DIAGNOSES   Final diagnoses:  Left flank pain  Lumbar strain, initial encounter     Rx / DC Orders   ED Discharge Orders     None        Note:  This document was prepared using Dragon voice recognition software and may include unintentional dictation errors.    Cyrena Mylar, MD 09/18/23 [id number]

## 2023-09-17 NOTE — ED Triage Notes (Addendum)
 Pt reports left flank that started earlier today and progressively worsened throughout the day and radiated to mid back. Pt reports having gallbladder removed 1 week prior and concerned the pain is related. Denies n/v/d.  Pt reports having pain p surgery but this pain is new and in a different area.

## 2023-09-18 ENCOUNTER — Emergency Department: Payer: Self-pay

## 2023-09-18 LAB — URINALYSIS, W/ REFLEX TO CULTURE (INFECTION SUSPECTED)
Bacteria, UA: NONE SEEN
Bilirubin Urine: NEGATIVE
Glucose, UA: NEGATIVE mg/dL
Ketones, ur: NEGATIVE mg/dL
Leukocytes,Ua: NEGATIVE
Nitrite: NEGATIVE
Protein, ur: NEGATIVE mg/dL
Specific Gravity, Urine: 1.014 (ref 1.005–1.030)
pH: 5 (ref 5.0–8.0)

## 2023-09-18 NOTE — Discharge Instructions (Signed)
 Fortunately your lab testing, urinalysis and CT scan did not show any emergency findings that account for your pain.  I think you strained your back.  Continue taking pain medications as prescribed.  Thank you for choosing us  for your health care today!  Please see your primary doctor this week for a follow up appointment.   If you have any new, worsening, or unexpected symptoms call your doctor right away or come back to the emergency department for reevaluation.  It was my pleasure to care for you today.   Ginnie EDISON Cyrena, MD

## 2023-10-20 ENCOUNTER — Encounter: Payer: Self-pay | Admitting: Gastroenterology
